# Patient Record
Sex: Female | Born: 1965
Health system: Southern US, Community
[De-identification: ages and names within clinical notes are randomized; demographics above are authoritative.]

## PROBLEM LIST (undated history)

## (undated) DIAGNOSIS — IMO0001 Reserved for inherently not codable concepts without codable children: Secondary | ICD-10-CM

## (undated) DIAGNOSIS — I10 Essential (primary) hypertension: Secondary | ICD-10-CM

## (undated) DIAGNOSIS — R002 Palpitations: Secondary | ICD-10-CM

## (undated) DIAGNOSIS — E669 Obesity, unspecified: Secondary | ICD-10-CM

## (undated) DIAGNOSIS — M65311 Trigger thumb, right thumb: Secondary | ICD-10-CM

## (undated) DIAGNOSIS — G43909 Migraine, unspecified, not intractable, without status migrainosus: Secondary | ICD-10-CM

## (undated) DIAGNOSIS — E119 Type 2 diabetes mellitus without complications: Secondary | ICD-10-CM

## (undated) DIAGNOSIS — I219 Acute myocardial infarction, unspecified: Secondary | ICD-10-CM

## (undated) DIAGNOSIS — J189 Pneumonia, unspecified organism: Secondary | ICD-10-CM

## (undated) DIAGNOSIS — Z98811 Dental restoration status: Secondary | ICD-10-CM

## (undated) DIAGNOSIS — E785 Hyperlipidemia, unspecified: Secondary | ICD-10-CM

## (undated) DIAGNOSIS — F32A Depression, unspecified: Secondary | ICD-10-CM

## (undated) DIAGNOSIS — F40240 Claustrophobia: Secondary | ICD-10-CM

## (undated) DIAGNOSIS — E11319 Type 2 diabetes mellitus with unspecified diabetic retinopathy without macular edema: Secondary | ICD-10-CM

## (undated) DIAGNOSIS — Z794 Long term (current) use of insulin: Secondary | ICD-10-CM

## (undated) HISTORY — PX: GANGLION CYST EXCISION: SHX1691

## (undated) HISTORY — PX: ABDOMINAL HYSTERECTOMY: SHX81

## (undated) HISTORY — DX: Hyperlipidemia, unspecified: E78.5

## (undated) HISTORY — PX: CARPAL TUNNEL RELEASE: SHX101

## (undated) HISTORY — DX: Essential (primary) hypertension: I10

## (undated) HISTORY — PX: DE QUERVAIN'S RELEASE: SHX1439

## (undated) HISTORY — PX: UTERINE SUSPENSION: SUR1430

## (undated) HISTORY — DX: Type 2 diabetes mellitus with unspecified diabetic retinopathy without macular edema: E11.319

## (undated) HISTORY — DX: Obesity, unspecified: E66.9

## (undated) HISTORY — PX: INGUINAL HERNIA REPAIR: SHX194

---

## 1989-02-04 HISTORY — PX: CHOLECYSTECTOMY: SHX55

## 1998-09-01 ENCOUNTER — Inpatient Hospital Stay (HOSPITAL_COMMUNITY): Admission: AD | Admit: 1998-09-01 | Discharge: 1998-09-02 | Payer: Self-pay | Admitting: Cardiology

## 2002-02-11 ENCOUNTER — Emergency Department (HOSPITAL_COMMUNITY): Admission: EM | Admit: 2002-02-11 | Discharge: 2002-02-11 | Payer: Self-pay | Admitting: *Deleted

## 2002-02-11 ENCOUNTER — Encounter: Payer: Self-pay | Admitting: *Deleted

## 2007-03-17 ENCOUNTER — Ambulatory Visit (HOSPITAL_COMMUNITY): Admission: RE | Admit: 2007-03-17 | Discharge: 2007-03-17 | Payer: Self-pay | Admitting: Gastroenterology

## 2007-05-26 ENCOUNTER — Inpatient Hospital Stay (HOSPITAL_COMMUNITY): Admission: EM | Admit: 2007-05-26 | Discharge: 2007-05-28 | Payer: Self-pay | Admitting: *Deleted

## 2007-05-27 HISTORY — PX: CARDIAC CATHETERIZATION: SHX172

## 2008-04-10 ENCOUNTER — Emergency Department (HOSPITAL_COMMUNITY): Admission: EM | Admit: 2008-04-10 | Discharge: 2008-04-10 | Payer: Self-pay | Admitting: Emergency Medicine

## 2009-08-03 ENCOUNTER — Ambulatory Visit (HOSPITAL_COMMUNITY): Admission: RE | Admit: 2009-08-03 | Discharge: 2009-08-03 | Payer: Self-pay | Admitting: Internal Medicine

## 2009-09-04 ENCOUNTER — Ambulatory Visit (HOSPITAL_COMMUNITY): Admission: RE | Admit: 2009-09-04 | Discharge: 2009-09-04 | Payer: Self-pay | Admitting: General Surgery

## 2009-09-04 HISTORY — PX: SOFT TISSUE MASS EXCISION: SHX2419

## 2010-01-19 ENCOUNTER — Inpatient Hospital Stay (HOSPITAL_COMMUNITY): Admission: EM | Admit: 2010-01-19 | Discharge: 2010-01-21 | Payer: Self-pay | Source: Home / Self Care

## 2010-02-25 ENCOUNTER — Encounter: Payer: Self-pay | Admitting: Internal Medicine

## 2010-03-29 ENCOUNTER — Other Ambulatory Visit: Payer: Self-pay | Admitting: Gastroenterology

## 2010-04-16 LAB — URINALYSIS, ROUTINE W REFLEX MICROSCOPIC
Bilirubin Urine: NEGATIVE
Glucose, UA: NEGATIVE mg/dL
Ketones, ur: NEGATIVE mg/dL
Nitrite: NEGATIVE
Protein, ur: NEGATIVE mg/dL
pH: 6 (ref 5.0–8.0)

## 2010-04-16 LAB — DIFFERENTIAL
Basophils Relative: 0 % (ref 0–1)
Eosinophils Absolute: 0.2 10*3/uL (ref 0.0–0.7)
Eosinophils Absolute: 0.4 10*3/uL (ref 0.0–0.7)
Eosinophils Relative: 2 % (ref 0–5)
Eosinophils Relative: 2 % (ref 0–5)
Lymphocytes Relative: 16 % (ref 12–46)
Lymphocytes Relative: 19 % (ref 12–46)
Lymphs Abs: 2.4 10*3/uL (ref 0.7–4.0)
Lymphs Abs: 2.4 10*3/uL (ref 0.7–4.0)
Lymphs Abs: 3 10*3/uL (ref 0.7–4.0)
Neutro Abs: 13.9 10*3/uL — ABNORMAL HIGH (ref 1.7–7.7)
Neutro Abs: 9.5 10*3/uL — ABNORMAL HIGH (ref 1.7–7.7)
Neutrophils Relative %: 72 % (ref 43–77)

## 2010-04-16 LAB — APTT: aPTT: 32 seconds (ref 24–37)

## 2010-04-16 LAB — CBC
HCT: 34.4 % — ABNORMAL LOW (ref 36.0–46.0)
HCT: 39.4 % (ref 36.0–46.0)
Hemoglobin: 11.9 g/dL — ABNORMAL LOW (ref 12.0–15.0)
MCH: 27.7 pg (ref 26.0–34.0)
MCH: 28.2 pg (ref 26.0–34.0)
MCHC: 34.6 g/dL (ref 30.0–36.0)
MCV: 80.5 fL (ref 78.0–100.0)
Platelets: 293 10*3/uL (ref 150–400)
Platelets: 305 10*3/uL (ref 150–400)
RBC: 4.4 MIL/uL (ref 3.87–5.11)
RBC: 5.01 MIL/uL (ref 3.87–5.11)
RDW: 12.9 % (ref 11.5–15.5)
RDW: 13.1 % (ref 11.5–15.5)
WBC: 18.5 10*3/uL — ABNORMAL HIGH (ref 4.0–10.5)

## 2010-04-16 LAB — COMPREHENSIVE METABOLIC PANEL
CO2: 27 mEq/L (ref 19–32)
Calcium: 8.5 mg/dL (ref 8.4–10.5)
Creatinine, Ser: 0.52 mg/dL (ref 0.4–1.2)
GFR calc non Af Amer: >60 mL/min (ref >60)
Glucose, Bld: 142 mg/dL — ABNORMAL HIGH (ref 70–99)

## 2010-04-16 LAB — PROTIME-INR: Prothrombin Time: 12.8 seconds (ref 11.6–15.2)

## 2010-04-16 LAB — BASIC METABOLIC PANEL
BUN: 8 mg/dL (ref 6–23)
Chloride: 98 mEq/L (ref 96–112)
Creatinine, Ser: 0.55 mg/dL (ref 0.4–1.2)
GFR calc Af Amer: >60 mL/min (ref >60)
GFR calc non Af Amer: >60 mL/min (ref >60)
Potassium: 3.5 mEq/L (ref 3.5–5.1)

## 2010-04-16 LAB — HEMOGLOBIN A1C
Hgb A1c MFr Bld: 6.7 % — ABNORMAL HIGH (ref <5.7)
Mean Plasma Glucose: 146 mg/dL — ABNORMAL HIGH (ref <117)

## 2010-04-16 LAB — HEPATIC FUNCTION PANEL
Albumin: 3.8 g/dL (ref 3.5–5.2)
Bilirubin, Direct: 0.1 mg/dL (ref 0.0–0.3)
Indirect Bilirubin: 0.3 mg/dL (ref 0.3–0.9)
Total Bilirubin: 0.4 mg/dL (ref 0.3–1.2)

## 2010-04-16 LAB — LIPASE, BLOOD: Lipase: 28 U/L (ref 11–59)

## 2010-04-16 LAB — GLUCOSE, CAPILLARY
Glucose-Capillary: 105 mg/dL — ABNORMAL HIGH (ref 70–99)
Glucose-Capillary: 117 mg/dL — ABNORMAL HIGH (ref 70–99)
Glucose-Capillary: 144 mg/dL — ABNORMAL HIGH (ref 70–99)
Glucose-Capillary: 99 mg/dL (ref 70–99)

## 2010-04-21 LAB — CBC
HCT: 36.2 % (ref 36.0–46.0)
Hemoglobin: 12.4 g/dL (ref 12.0–15.0)
MCHC: 34.4 g/dL (ref 30.0–36.0)
WBC: 12.8 10*3/uL — ABNORMAL HIGH (ref 4.0–10.5)

## 2010-04-21 LAB — URINALYSIS, ROUTINE W REFLEX MICROSCOPIC
Ketones, ur: NEGATIVE mg/dL
Protein, ur: NEGATIVE mg/dL
Urobilinogen, UA: 0.2 mg/dL (ref 0.0–1.0)

## 2010-04-21 LAB — COMPREHENSIVE METABOLIC PANEL
ALT: 24 U/L (ref 0–35)
Alkaline Phosphatase: 67 U/L (ref 39–117)
CO2: 27 mEq/L (ref 19–32)
Chloride: 107 mEq/L (ref 96–112)
GFR calc non Af Amer: >60 mL/min (ref >60)
Glucose, Bld: 182 mg/dL — ABNORMAL HIGH (ref 70–99)
Potassium: 3.7 mEq/L (ref 3.5–5.1)
Sodium: 140 mEq/L (ref 135–145)
Total Bilirubin: 0.6 mg/dL (ref 0.3–1.2)

## 2010-04-21 LAB — SURGICAL PCR SCREEN: MRSA, PCR: NEGATIVE

## 2010-04-21 LAB — DIFFERENTIAL
Basophils Relative: 1 % (ref 0–1)
Eosinophils Absolute: 0.3 10*3/uL (ref 0.0–0.7)
Neutrophils Relative %: 74 % (ref 43–77)

## 2010-05-17 LAB — GLUCOSE, CAPILLARY: Glucose-Capillary: 223 mg/dL — ABNORMAL HIGH (ref 70–99)

## 2010-06-19 NOTE — H&P (Signed)
[name], [name]                 ACCOUNT NO.:  [id number]   MEDICAL RECORD NO.:  [id number]         PATIENT TYPE:  PEMS   LOCATION:  ED                            FACILITY:  [facility]   PHYSICIAN:  Mila Homer. Sudie Bailey, M.D.DATE OF BIRTH:  May 27, 1965   DATE OF ADMISSION:  05/26/2007  DATE OF DISCHARGE:  04/21/2009LH                              HISTORY & PHYSICAL   SUBJECTIVE:  This 45 year old woman was at home this morning having  finished eating out with her husband, and around 11:30 a.m. developed  severe substernal chest pain.  Pain persisted, she came to the hospital.   She has had bouts of indigestion noted before in the upper abdomen,  but nothing ever in the sternum, never has had heartburn.   FAMILY HISTORY:  Remarkable.  On her paternal side, her grandfather died  at age 55 of heart-related problems.  Her father had a massive heart  attack with CHF, died at age 28 about 6 years ago, and she has a  brother age 79, who has had a massive heart attack  and now has lost  the left side of his heart and is disabled. Family members with heart  disease being followed by Dr. Alanda Amass of [facility]  [facility].  The patient denied palpitations or cough.   The patient herself, currently is age 59, has a number of risk factors  including diabetes, hypertension, hypercholesterolemia, and cigarette  smoking.  She stopped smoking 11 years ago, but had smoked for 18 years  in the meantime.  She averaged about a pack a day.   CURRENT MEDICATIONS:  1. Lisinopril.  2. Hydrochlorothiazide  20/25 daily.  3. Metformin 1000 mg b.i.d.  4. Glipizide XR 10 mg daily.  5. Norvasc 5 mg daily.   HOSPITAL COURSE:  She was seen in the emergency room by Dr. Margretta Ditty, ER  physician, and treated with a number of agents without benefit.  She had  GI cocktail, which did not help; nitroglycerin sublingually, which did  not help; and  most recently is getting Maalox.  She has even had  morphine 2  mg IV.   PHYSICAL EXAMINATION:  GENERAL:  The ER exam showed a pleasant middle-  aged woman who is obviously obese.  She is in distress due to the  substernal chest pain and foot pain with the palm on the chest.  Color  was good.  She appeared to be good historian.  Husband was also in the  room with her.  She is oriented, alert, well-developed, and well-  nourished.  SKIN:  Turgor is normal.  HEENT:  Mucous membranes are moist.  HEART:  Regular rhythm and rate of 80 on my exam.  LUNGS:  Clear throughout and she is moving air well.  ABDOMEN:  Soft without organomegaly or mass.  She had absolutely no  epigastric pain on palpation.  All the pains she complained  of was  actually substernal.  EXTREMITIES:  There is no edema of the ankles.   VITAL SIGNS:  On admission showed temperature 97.8, blood pressure  146/82, pulse  84, respiratory rate 16, and O2 sat 97%.   LABORATORY DATA:  Her admission white cell count is 13,500 of which 75%  were neutrophils, and 18% lymphocytes.  H&H was 13.2/37.1, MCV of 81,  and platelet count of 324,000.  Her admission myoglobin was 37, CK-MB  1.9, troponin less than 0.05 and her BMP showed a glucose 256, BUN 6,  and creatinine 0.50.   EKG was seen and evaluated by the ER physician, apparently was normal.   DIAGNOSES:  1. Persistent chest pain now that has been present for 6-1/2 hours and      does not resolve with any medication given so far.  2. Very positive family history for coronary artery disease.  3. Positive risk factor for coronary artery disease.  4. Benign essential hypertension.  5. Type 2 diabetes.  6. Hypercholesterolemia.  7. A history of tobacco use.  8. Morbid obesity.   PLAN:  I have discussed with Dr. Earlyne Iba.  Requesting of the  stat EKG and cardiac enzymes and also request that she be transferred  down to [facility] to be attended to by [facility],  a member of which is Dr. Alanda Amass, the family  cardiologist.  If [facility] is full she can be followed here at least overnight on a  monitor and IV fluids but preferably transfer given the family history  and the personal history of risk factors for coronary artery disease.      Mila Homer. Sudie Bailey, M.D.  Electronically Signed     SDK/MEDQ  D:  05/26/2007  T:  05/27/2007  Job:  [id number]

## 2010-06-19 NOTE — Cardiovascular Report (Signed)
NAMEMEGHANNE, [name]                 ACCOUNT NO.:  [id number]   MEDICAL RECORD NO.:  [id number]          PATIENT TYPE:  INP   LOCATION:  3728                         FACILITY:  [facility]   PHYSICIAN:  Nicki Guadalajara, M.D.     DATE OF BIRTH:  July 29, 1965   DATE OF PROCEDURE:  DATE OF DISCHARGE:  05/26/2007                            CARDIAC CATHETERIZATION   INDICATIONS:  Ms. [name] is a 45 year old female with a history of  type 2 diabetes mellitus, hypertension, and strong family history of  coronary artery disease.  She was admitted to [facility]  yesterday after experiencing episodes of intermittent squeezing chest  pressure.  She works as a Engineer, civil (consulting) at [employer].  She denies any  significant exertional symptomatology.  She was admitted to [facility] last evening.  In light of her family history and cardiac risk  factors, definitive cardiac catheterization was recommended.   PROCEDURE:  After premedication with Versed intravenously, the patient  was prepped and draped in usual fashion.  She also complained of some  mild low back discomfort and also received 25 mg of fentanyl.  Her right  femoral artery was punctured anteriorly and a 5-French sheath was  inserted.  Diagnostic catheterization was done utilizing 5-French  Judkins for left and right coronary catheters.  A 5-French pigtail  catheter was used for RAO ventriculography.  With her hypertensive  history, distal aortography was also performed to exclude any potential  for renal artery stenosis.  She tolerated the procedure well.  Hemostasis was obtained by direct manual pressure.   HEMODYNAMIC DATA:  Central aortic pressure is 125/75.  Left ventricular  pressure is 125/14.   ANGIOGRAPHIC DATA:  The left main coronary artery was angiographically  normal and trifurcated into an LAD.  A large ramus intermediate vessel,  and a moderate circumflex vessel.  The left main was angiographically  normal.   The LAD was a moderate-sized vessel that gave rise to a large septal  perforating artery and large diagonal vessel.  The LAD extended to, but  did not wrap around the LV apex.   The ramus intermediate vessel was a very large caliber vessel that gave  rise to several branches and extended to the LV apex.   The AV groove circumflex was small-to-moderate size vessel that gave  rise to one small marginal vessel.   The right coronary artery was a large-caliber dominant vessel that gave  rise to a very large PDA system, which supplied the entire inferior wall  and extended to the inferior LV apex.  The distal RCA also gave rise to  an inferior LV branch as well as a posterolateral coronary artery.  The  RCA and its branches were normal.   RAO ventriculography revealed normal LV contractility without focal  segmental wall motion abnormalities.   Distal aortography revealed a normal aortoiliac system.  Renal arteries  were widely patent without stenosis.   IMPRESSION:  1. Normal LV function.  2. Normal coronary arteries.           ______________________________  Nicki Guadalajara,  M.D.     TK/MEDQ  D:  05/27/2007  T:  05/28/2007  Job:  [id number]   cc:   Dani Gobble, MD  Richard A. Alanda Amass, M.D.

## 2010-06-19 NOTE — Op Note (Signed)
NAMESANDER, SPECKMAN                 ACCOUNT NO.:  [id number]   MEDICAL RECORD NO.:  [id number]          PATIENT TYPE:  AMB   LOCATION:  ENDO                         FACILITY:  [facility]   PHYSICIAN:  John C. Madilyn Fireman, M.D.    DATE OF BIRTH:  1966/01/18   DATE OF PROCEDURE:  03/17/2007  DATE OF DISCHARGE:                               OPERATIVE REPORT   PROCEDURE:  Colonoscopy, polypectomy.   INDICATIONS FOR PROCEDURE:  Strong family history of colon cancer in two  first-degree relatives.   PROCEDURE:  The patient was placed in the left lateral decubitus  position and placed on the pulse monitor with continuous low-flow oxygen  delivered by nasal cannula.  She was sedated with 125 mcg IV fentanyl  and 12.5 mg IV Versed.  The Olympus video colonoscope was inserted into  the rectum and advanced to the cecum, confirmed by transillumination of  McBurney's point and visualization of ileocecal valve and appendiceal  orifice.  The prep was good.  The cecum, ascending, transverse,  descending and sigmoid colon all appeared normal with no masses, polyps,  diverticula or other mucosal abnormalities.  At the rectosigmoid  junction there was a small polyp approximately 6 mm in diameter.  This  removed by snare.  The remainder of colon appeared normal.  Scope was  then withdrawn and the patient returned to the recovery room in stable  condition.  She tolerated the procedure well.  There were no immediate  complications.   IMPRESSION:  1. Small rectosigmoid polyp, otherwise normal study.   PLAN:  Await histology and will probably repeat colonoscopy in 3 years.           ______________________________  Everardo All Madilyn Fireman, M.D.     JCH/MEDQ  D:  03/17/2007  T:  03/18/2007  Job:  [id number]   cc:   Catalina Pizza, M.D.

## 2010-06-22 NOTE — Discharge Summary (Signed)
[name], [name]                 ACCOUNT NO.:  [id number]   MEDICAL RECORD NO.:  [id number]          PATIENT TYPE:  INP   LOCATION:  3728                         FACILITY:  [facility]   PHYSICIAN:  [name], M.D.     DATE OF BIRTH:  10/05/1965   DATE OF ADMISSION:  05/26/2007  DATE OF DISCHARGE:  05/28/2007                               DISCHARGE SUMMARY   HISTORY OF PRESENT ILLNESS:  [name] is a 45 year old female  with a prior medical history of NIDDM, hypertension, and premature  family history of heart disease.  She came to the emergency room with  complaints of sudden onset of chest pain.  She was admitted, placed on  intravenous heparin and nitroglycerin, and decision was for her to  undergo cardiac catheterization.  This was performed on May 27, 2007,  by Dr. Nicki Conner.  She had normal coronary arteries and normal LV  function.  On May 28, 2007, she was seen by Dr. Nicki Conner,  considered stable for discharged home with blood pressure of 128/76, her  heart rate was 86, respirations 18, O2 saturation 96%, and her  temperature was 98.4.   LABORATORY DATA:  Hemoglobin 12.2, hematocrit 34.4, WBC 15.2, and  platelets 295.  Sodium 129,  potassium 3.8, BUN 2, creatinine 0.43, and  glucose was 202.  Hemoglobin A1c was 8.6 and amylase was 15.  CK-MBs and  troponins were negative x2.  Total cholesterol was 186, triglycerides  363, HDL was 23, and LDL was 90.  TSH was 3.073.  Urine had bacteria,  but the culture revealed 50,000 of multiple bacterial types.  I do not  see a chest x-ray report in the chart at the time of this dictation.   DISCHARGE MEDICATIONS:  1. Lisinopril/hydrochlorothiazide 20/25 daily.  2. Metformin.  She is to hold until 48 hours of her cath, which will      be restarted on May 30, 2007.  3. Glipizide 10 mg everyday.  4. Norvasc 5 mg everyday.  5. Aspirin 81 mg a day.  6. She wishes to start TriCor 145 mg a day and Crestor 10 mg a day.  7.  She will increase her aspirin to 325 mg a day.   DISCHARGE DIAGNOSES:  1. Chest pain not coronary ischemia, possible musculoskeletal versus      gastrointestinal etiology.  2. Non-insulin-dependent diabetes mellitus, not well controlled.  3. Dyslipidemia.  4. Gastroesophageal reflux disease, questionable esophageal spasm.      [name], N.P.    ______________________________  [name], M.D.    BB/MEDQ  D:  06/22/2007  T:  06/23/2007  Job:  [id number]

## 2010-10-30 LAB — URINALYSIS, MICROSCOPIC ONLY
Glucose, UA: NEGATIVE
Ketones, ur: NEGATIVE
Leukocytes, UA: NEGATIVE
Nitrite: NEGATIVE
Protein, ur: NEGATIVE
Urobilinogen, UA: 1

## 2010-10-30 LAB — HEPARIN LEVEL (UNFRACTIONATED)
Heparin Unfractionated: 0.23 — ABNORMAL LOW
Heparin Unfractionated: <0.1 — ABNORMAL LOW

## 2010-10-30 LAB — BASIC METABOLIC PANEL
BUN: 2 — ABNORMAL LOW
CO2: 28
CO2: 28
Calcium: 9.3
Calcium: 8.5
Chloride: 103
Chloride: 103
Creatinine, Ser: 0.43
Creatinine, Ser: 0.49
GFR calc Af Amer: >60
GFR calc non Af Amer: >60
Glucose, Bld: 256 — ABNORMAL HIGH
Glucose, Bld: 202 — ABNORMAL HIGH
Glucose, Bld: 204 — ABNORMAL HIGH
Potassium: 3.8
Sodium: 136
Sodium: 135

## 2010-10-30 LAB — LIPID PANEL
HDL: 23 — ABNORMAL LOW
Total CHOL/HDL Ratio: 8.1
Triglycerides: 363 — ABNORMAL HIGH

## 2010-10-30 LAB — DIFFERENTIAL
Basophils Absolute: 0
Basophils Relative: 0
Eosinophils Absolute: 0.2
Eosinophils Relative: 2
Monocytes Absolute: 0.8
Monocytes Relative: 6
Neutro Abs: 10.1 — ABNORMAL HIGH

## 2010-10-30 LAB — HEMOGLOBIN A1C: Hgb A1c MFr Bld: 8.6 — ABNORMAL HIGH

## 2010-10-30 LAB — CBC
HCT: 34.4 — ABNORMAL LOW
Hemoglobin: 13.2
MCHC: 35.4
MCHC: 34.5
MCHC: 35.4
MCV: 81
MCV: 81.5
Platelets: 295
Platelets: 315
RBC: 4.33
RDW: 12.6
WBC: 15.2 — ABNORMAL HIGH
WBC: 16.2 — ABNORMAL HIGH

## 2010-10-30 LAB — TROPONIN I

## 2010-10-30 LAB — CK TOTAL AND CKMB (NOT AT ARMC)
CK, MB: 2.3
CK, MB: 2.9
CK, MB: 2.1
Relative Index: INVALID
Relative Index: INVALID
Total CK: 45

## 2010-10-30 LAB — POCT CARDIAC MARKERS
CKMB, poc: 1.9
Myoglobin, poc: 37.2
Troponin i, poc: <0.05

## 2010-10-30 LAB — TSH: TSH: 3.073

## 2010-10-30 LAB — URINE CULTURE: Colony Count: 50000

## 2010-10-30 LAB — AMYLASE: Amylase: 15 — ABNORMAL LOW

## 2010-10-30 LAB — PROTIME-INR: Prothrombin Time: 13.3

## 2011-07-17 ENCOUNTER — Ambulatory Visit: Payer: Self-pay

## 2011-08-12 ENCOUNTER — Other Ambulatory Visit: Payer: Self-pay | Admitting: Internal Medicine

## 2011-08-12 DIAGNOSIS — R1010 Upper abdominal pain, unspecified: Secondary | ICD-10-CM

## 2011-08-13 ENCOUNTER — Ambulatory Visit
Admission: RE | Admit: 2011-08-13 | Discharge: 2011-08-13 | Disposition: A | Discharge status: Home / Self Care | Payer: 59 | Source: Ambulatory Visit | Attending: Internal Medicine | Admitting: Internal Medicine

## 2011-08-13 ENCOUNTER — Ambulatory Visit: Admission: RE | Admit: 2011-08-13 | Payer: 59 | Source: Ambulatory Visit

## 2011-08-13 ENCOUNTER — Other Ambulatory Visit: Payer: Self-pay | Admitting: Internal Medicine

## 2011-08-13 ENCOUNTER — Other Ambulatory Visit: Payer: 59

## 2011-08-13 DIAGNOSIS — R1010 Upper abdominal pain, unspecified: Secondary | ICD-10-CM

## 2011-08-13 DIAGNOSIS — R52 Pain, unspecified: Secondary | ICD-10-CM

## 2011-08-13 MED ORDER — IOHEXOL 300 MG/ML  SOLN
125.0000 mL | Freq: Once | INTRAMUSCULAR | Status: AC | PRN
  Administered 2011-08-13: 125 mL via INTRAVENOUS

## 2011-08-22 ENCOUNTER — Ambulatory Visit: Payer: Self-pay

## 2011-09-19 ENCOUNTER — Encounter: Payer: 59 | Attending: Internal Medicine | Admitting: Dietician

## 2011-09-19 VITALS — Ht 68.0 in | Wt 241.0 lb

## 2011-09-19 DIAGNOSIS — E119 Type 2 diabetes mellitus without complications: Secondary | ICD-10-CM

## 2011-09-21 ENCOUNTER — Encounter: Payer: Self-pay | Admitting: Dietician

## 2011-09-21 NOTE — Progress Notes (Signed)
  Patient was seen on 09/19/2011 for the first of a series of three diabetes self-management courses at the [facility]. The following learning objectives were met by the patient during this course:   Defines the role of glucose and insulin  Identifies type of diabetes and pathophysiology  Defines the diagnostic criteria for diabetes and prediabetes  States the risk factors for Type 2 Diabetes  States the symptoms of Type 2 Diabetes  Defines Type 2 Diabetes treatment goals  Defines Type 2 Diabetes treatment options  States the rationale for glucose monitoring  Identifies A1C, glucose targets, and testing times  Identifies proper sharps disposal  Defines the purpose of a diabetes food plan  Identifies carbohydrate food groups  Defines effects of carbohydrate foods on glucose levels  Identifies carbohydrate choices/grams/food labels  States benefits of physical activity and effect on glucose  Review of suggested activity guidelines  Handouts given during class include:  Type 2 Diabetes: Basics Book  My Food Plan Book  Food and Activity Log  Current A1C: 7.7%  Patient has established the following initial goals:  Increase exercise  Work on stress.  Follow a diabetes meal plan  Lose Weight  Follow-Up Plan: Complete the two remaining Diabetes Self-Management Core Classes

## 2011-11-09 ENCOUNTER — Ambulatory Visit: Payer: 59

## 2011-11-21 ENCOUNTER — Ambulatory Visit: Payer: 59

## 2012-02-24 ENCOUNTER — Other Ambulatory Visit: Payer: Self-pay | Admitting: Internal Medicine

## 2012-02-24 DIAGNOSIS — R911 Solitary pulmonary nodule: Secondary | ICD-10-CM

## 2012-02-27 ENCOUNTER — Ambulatory Visit
Admission: RE | Admit: 2012-02-27 | Discharge: 2012-02-27 | Disposition: A | Discharge status: Home / Self Care | Payer: 59 | Source: Ambulatory Visit | Attending: Internal Medicine | Admitting: Internal Medicine

## 2012-02-27 DIAGNOSIS — R911 Solitary pulmonary nodule: Secondary | ICD-10-CM

## 2012-02-27 MED ORDER — IOHEXOL 300 MG/ML  SOLN
75.0000 mL | Freq: Once | INTRAMUSCULAR | Status: AC | PRN
  Administered 2012-02-27: 75 mL via INTRAVENOUS

## 2013-05-26 ENCOUNTER — Other Ambulatory Visit: Payer: Self-pay

## 2013-05-26 DIAGNOSIS — Z1231 Encounter for screening mammogram for malignant neoplasm of breast: Secondary | ICD-10-CM

## 2013-06-07 ENCOUNTER — Encounter (INDEPENDENT_AMBULATORY_CARE_PROVIDER_SITE_OTHER): Payer: Self-pay

## 2013-06-07 ENCOUNTER — Ambulatory Visit
Admission: RE | Admit: 2013-06-07 | Discharge: 2013-06-07 | Disposition: A | Discharge status: Home / Self Care | Payer: 59 | Source: Ambulatory Visit

## 2013-06-07 DIAGNOSIS — Z1231 Encounter for screening mammogram for malignant neoplasm of breast: Secondary | ICD-10-CM

## 2013-07-22 ENCOUNTER — Other Ambulatory Visit (HOSPITAL_COMMUNITY): Payer: Self-pay | Admitting: Orthopedic Surgery

## 2013-07-22 DIAGNOSIS — M25512 Pain in left shoulder: Secondary | ICD-10-CM

## 2013-07-27 ENCOUNTER — Ambulatory Visit (HOSPITAL_COMMUNITY): Payer: 59

## 2013-08-09 ENCOUNTER — Ambulatory Visit (HOSPITAL_COMMUNITY)
Admission: RE | Admit: 2013-08-09 | Discharge: 2013-08-09 | Disposition: A | Discharge status: Home / Self Care | Payer: 59 | Source: Ambulatory Visit | Attending: Orthopedic Surgery | Admitting: Orthopedic Surgery

## 2013-08-09 DIAGNOSIS — M25512 Pain in left shoulder: Secondary | ICD-10-CM

## 2015-09-13 ENCOUNTER — Other Ambulatory Visit: Payer: Self-pay | Admitting: Physician Assistant

## 2015-10-11 ENCOUNTER — Emergency Department
Admission: EM | Admit: 2015-10-11 | Discharge: 2015-10-11 | Disposition: A | Discharge status: Home / Self Care | Payer: 59 | Attending: Student in an Organized Health Care Education/Training Program | Admitting: Student in an Organized Health Care Education/Training Program

## 2015-10-11 ENCOUNTER — Emergency Department: Payer: 59

## 2015-10-11 ENCOUNTER — Encounter: Payer: Self-pay | Admitting: Emergency Medicine

## 2015-10-11 DIAGNOSIS — Y999 Unspecified external cause status: Secondary | ICD-10-CM | POA: Insufficient documentation

## 2015-10-11 DIAGNOSIS — Y92410 Unspecified street and highway as the place of occurrence of the external cause: Secondary | ICD-10-CM | POA: Insufficient documentation

## 2015-10-11 DIAGNOSIS — I10 Essential (primary) hypertension: Secondary | ICD-10-CM | POA: Diagnosis not present

## 2015-10-11 DIAGNOSIS — S39012A Strain of muscle, fascia and tendon of lower back, initial encounter: Secondary | ICD-10-CM | POA: Diagnosis not present

## 2015-10-11 DIAGNOSIS — R0789 Other chest pain: Secondary | ICD-10-CM | POA: Insufficient documentation

## 2015-10-11 DIAGNOSIS — S40011A Contusion of right shoulder, initial encounter: Secondary | ICD-10-CM | POA: Insufficient documentation

## 2015-10-11 DIAGNOSIS — Y939 Activity, unspecified: Secondary | ICD-10-CM | POA: Diagnosis not present

## 2015-10-11 DIAGNOSIS — Z7984 Long term (current) use of oral hypoglycemic drugs: Secondary | ICD-10-CM | POA: Diagnosis not present

## 2015-10-11 DIAGNOSIS — Z794 Long term (current) use of insulin: Secondary | ICD-10-CM | POA: Diagnosis not present

## 2015-10-11 DIAGNOSIS — S3992XA Unspecified injury of lower back, initial encounter: Secondary | ICD-10-CM | POA: Diagnosis present

## 2015-10-11 DIAGNOSIS — E119 Type 2 diabetes mellitus without complications: Secondary | ICD-10-CM | POA: Diagnosis not present

## 2015-10-11 MED ORDER — KETOROLAC TROMETHAMINE 30 MG/ML IJ SOLN
30.0000 mg | Freq: Once | INTRAMUSCULAR | Status: AC
  Administered 2015-10-11: 30 mg via INTRAMUSCULAR
  Filled 2015-10-11: qty 1

## 2015-10-11 MED ORDER — METHOCARBAMOL 500 MG PO TABS: 500.0000 mg | ORAL_TABLET | Freq: Four times a day (QID) | ORAL | 0 refills | Status: DC

## 2015-10-11 MED ORDER — MELOXICAM 15 MG PO TABS: 15.0000 mg | ORAL_TABLET | Freq: Every day | ORAL | 0 refills | Status: DC

## 2015-10-11 NOTE — ED Notes (Signed)
AAOx3.  Skin warm and dry. C/O pain to left shoulder and low back.  NAD.
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
## 2015-10-11 NOTE — ED Notes (Signed)
Toradol given per order.  Sling placed to right arm.  C/O lower back pain.  Roderic Palau Cuthriell notified of patient's c/o pain.  Continue to monitor.

## 2015-10-11 NOTE — ED Notes (Signed)
Pt. Given work excuse.  Pt. Going home with family.

## 2015-11-08 ENCOUNTER — Encounter: Payer: Self-pay | Admitting: Neurology

## 2015-11-08 ENCOUNTER — Ambulatory Visit (INDEPENDENT_AMBULATORY_CARE_PROVIDER_SITE_OTHER): Payer: 59 | Admitting: Neurology

## 2015-11-08 DIAGNOSIS — G43709 Chronic migraine without aura, not intractable, without status migrainosus: Secondary | ICD-10-CM | POA: Diagnosis not present

## 2015-11-08 DIAGNOSIS — IMO0002 Reserved for concepts with insufficient information to code with codable children: Secondary | ICD-10-CM | POA: Insufficient documentation

## 2015-11-08 MED ORDER — SUMATRIPTAN SUCCINATE 25 MG PO TABS: 25.0000 mg | ORAL_TABLET | ORAL | 6 refills | Status: DC | PRN

## 2015-11-08 MED ORDER — TOPIRAMATE 25 MG PO TABS: 50.0000 mg | ORAL_TABLET | Freq: Two times a day (BID) | ORAL | 6 refills | Status: DC

## 2015-11-08 NOTE — Progress Notes (Signed)
PATIENT: [name] DOB: [birth year]  Chief Complaint  Patient presents with  . Optic Nerve Edema    She is here with her husband, [place]. She had a recent abnormal eye exam that revealed bilateral optic nerve edema.  Reports a constant "heartbeat" in her ears and daily headaches.     HISTORICAL  [name], seen in refer by  Harlen Labs, MD, her primary care physician is Dr. Glendale Chard  She had a past medical history of hypertension, hyperlipidemia, diabetes since 2007, insulin-dependent since 2013, history of ulcerative colitis, which is under good control now, She is a Marine scientist, worked to home, looking at computer has been seen by Dr. Marin Comment since 2014, I reviewed and summarized in note from Dr. Marin Comment on October 06 2015, there was a concern of moderate bilateral papillary edema no evidence of visual field loss, bilateral optic disc 0.2/0.2 cup-to-disc ratio,   She had a history of migraine since 2016, described as ocular migraine, often started with visual distortion, sometimes loss of peripheral visual field, followed by behind eye pain, since August 2017, she began to have daily ocular migraines, blurry vision after  staring at her computer for a few hours, behind eye pain, blurry vision, floating travels through her eye field, mild retro-orbital area headache. She has been taking ibuprofen on a daily basis every 6 hours  She also complains frequent dizziness, transient, sometimes associated with her headaches, can happen in standing or sitting down position,  REVIEW OF SYSTEMS: Full 14 system review of systems performed and notable only for feeling hot, flushing, memory loss, headaches, dizziness, blurred vision, eye pain, fatigue, palpitations   ALLERGIES: Allergies  Allergen Reactions  . Azo [Phenazopyridine] Other (See Comments)    Facial swelling, hives, chest tightness.  . Morphine And Related     Dizziness, Vomiting  . Sulfa Antibiotics Rash    HOME  MEDICATIONS: Current Outpatient Prescriptions  Medication Sig Dispense Refill  . amLODipine (NORVASC) 5 MG tablet Take 5 mg by mouth daily.    . Cholecalciferol (VITAMIN D3) 5000 units TABS Take by mouth daily.    . insulin NPH-regular Human (NOVOLIN 70/30) (70-30) 100 UNIT/ML injection Inject 100 Units into the skin daily.    . Magnesium 500 MG TABS Take by mouth daily.    . meclizine (ANTIVERT) 25 MG tablet Take 25 mg by mouth 3 (three) times daily as needed for dizziness.    . meloxicam (MOBIC) 15 MG tablet Take 1 tablet (15 mg total) by mouth daily. 30 tablet 0  . methocarbamol (ROBAXIN) 500 MG tablet Take 1 tablet (500 mg total) by mouth 4 (four) times daily. 16 tablet 0  . metoprolol (LOPRESSOR) 50 MG tablet daily.    . simvastatin (ZOCOR) 20 MG tablet Take 20 mg by mouth daily.    . valsartan-hydrochlorothiazide (DIOVAN-HCT) 320-12.5 MG tablet Take 1 tablet by mouth daily.    Marland Kitchen XIIDRA 5 % SOLN 2 (two) times daily.     No current facility-administered medications for this visit.     PAST MEDICAL HISTORY: Past Medical History:  Diagnosis Date  . Colitis, ulcerative ([place])   . Diabetes mellitus   . Hyperlipidemia   . Hypertension   . Migraine   . Obesity   . Optic nerve edema   . Ulcerative colitis ([place])     PAST SURGICAL HISTORY: Past Surgical History:  Procedure Laterality Date  . ABDOMINAL HYSTERECTOMY    . CARPAL TUNNEL RELEASE    .  CHOLECYSTECTOMY    . HERNIA REPAIR    . LIPOMA EXCISION      FAMILY HISTORY: Family History  Problem Relation Age of Onset  . Cancer Other   . Hypertension Other   . Hyperlipidemia Other   . Heart attack Other   . Cancer Mother     Colorectal  . Heart disease Father   . Heart attack Father   . Cancer Sister     Colorectal  . Cancer Brother     Colorectal  . Heart disease Brother   . Heart attack Brother   . Heart disease Paternal Grandmother   . Heart disease Paternal Grandfather     SOCIAL HISTORY:  Social History    Social History  . Marital status: Married    Spouse name: N/A  . Number of children: 2  . Years of education: Associates   Occupational History  . Nurse    Social History Main Topics  . Smoking status: Former Smoker    Quit date: 1998  . Smokeless tobacco: Never Used  . Alcohol use Yes     Comment: Occasionally  . Drug use: No  . Sexual activity: Not on file   Other Topics Concern  . Not on file   Social History Narrative   Lives at home with husband.   Right-handed.   Occasional use of caffeine.     PHYSICAL EXAM   Vitals:   11/08/15 0938  BP: (!) 142/80  Pulse: 73  Weight: 241 lb 4 oz (109.4 kg)  Height: 5\' 7"  (1.702 m)    Not recorded      Body mass index is 37.79 kg/m.  PHYSICAL EXAMNIATION:  Gen: NAD, conversant, well nourised, obese, well groomed                     Cardiovascular: Regular rate rhythm, no peripheral edema, warm, nontender. Eyes: Conjunctivae clear without exudates or hemorrhage Neck: Supple, no carotid bruise. Pulmonary: Clear to auscultation bilaterally   NEUROLOGICAL EXAM:  MENTAL STATUS: Speech:    Speech is normal; fluent and spontaneous with normal comprehension.  Cognition:     Orientation to time, place and person     Normal recent and remote memory     Normal Attention span and concentration     Normal Language, naming, repeating,spontaneous speech     Fund of knowledge   CRANIAL NERVES: CN II: Visual fields are full to confrontation. Fundoscopic exam is normal with sharp discs and no vascular changes. Pupils are round equal and briskly reactive to light. CN III, IV, VI: extraocular movement are normal. No ptosis. CN V: Facial sensation is intact to pinprick in all 3 divisions bilaterally. Corneal responses are intact.  CN VII: Face is symmetric with normal eye closure and smile. CN VIII: Hearing is normal to rubbing fingers CN IX, X: Palate elevates symmetrically. Phonation is normal. CN XI: Head turning and  shoulder shrug are intact CN XII: Tongue is midline with normal movements and no atrophy.  MOTOR: There is no pronator drift of out-stretched arms. Muscle bulk and tone are normal. Muscle strength is normal.  REFLEXES: Reflexes are 2+ and symmetric at the biceps, triceps, knees, and ankles. Plantar responses are flexor.  SENSORY: Intact to light touch, pinprick, positional sensation and vibratory sensation are intact in fingers and toes.  COORDINATION: Rapid alternating movements and fine finger movements are intact. There is no dysmetria on finger-to-nose and heel-knee-shin.    GAIT/STANCE: Posture is  normal. Gait is steady with normal steps, base, arm swing, and turning. Heel and toe walking are normal. Tandem gait is normal.  Romberg is absent.   DIAGNOSTIC DATA (LABS, IMAGING, TESTING) - I reviewed patient records, labs, notes, testing and imaging myself where available.   ASSESSMENT AND PLAN  CARLETHIA GORNICK is a 50 y.o. female   Possible bilateral papillary edema,  though I could not appreciated today, was documented by her most recent optometrist to visit Proceed with MRI of the brain   Chronic migraine headaches,   likely a component of medicine rebound headache, she has been taking ibuprofen every 6 hours I have suggested her to stop date the and analgesic use Start Topamax titrating to 25 mg 2 tablets twice a day Imitrex 25 mg as needed for moderate to severe headache, Try to identify and avoid triggers   Marcial Pacas, M.D. Ph.D.  [facility] [address], [place], [place] [address] Ph: [phone] Fax: [phone]  CC: Adelina Mings Margret Chance, MD, her primary care physician is Dr. Glendale Chard

## 2015-12-07 ENCOUNTER — Other Ambulatory Visit: Payer: Self-pay | Admitting: *Deleted

## 2015-12-07 ENCOUNTER — Telehealth: Payer: Self-pay | Admitting: *Deleted

## 2015-12-07 MED ORDER — ALPRAZOLAM 0.5 MG PO TABS: ORAL_TABLET | ORAL | 0 refills | Status: DC

## 2015-12-07 NOTE — Telephone Encounter (Signed)
Printed, signed, faxed and confirmed to [facility].

## 2015-12-11 ENCOUNTER — Ambulatory Visit: Payer: 59 | Admitting: Neurology

## 2015-12-13 ENCOUNTER — Other Ambulatory Visit: Payer: Self-pay | Admitting: Family Medicine

## 2015-12-13 DIAGNOSIS — S39012A Strain of muscle, fascia and tendon of lower back, initial encounter: Secondary | ICD-10-CM

## 2015-12-13 DIAGNOSIS — S161XXA Strain of muscle, fascia and tendon at neck level, initial encounter: Secondary | ICD-10-CM

## 2015-12-16 ENCOUNTER — Ambulatory Visit
Admission: RE | Admit: 2015-12-16 | Discharge: 2015-12-16 | Disposition: A | Discharge status: Home / Self Care | Payer: 59 | Source: Ambulatory Visit | Attending: Neurology | Admitting: Neurology

## 2015-12-16 DIAGNOSIS — IMO0002 Reserved for concepts with insufficient information to code with codable children: Secondary | ICD-10-CM

## 2015-12-16 DIAGNOSIS — G43709 Chronic migraine without aura, not intractable, without status migrainosus: Secondary | ICD-10-CM | POA: Diagnosis not present

## 2015-12-23 ENCOUNTER — Ambulatory Visit
Admission: RE | Admit: 2015-12-23 | Discharge: 2015-12-23 | Disposition: A | Discharge status: Home / Self Care | Payer: 59 | Source: Ambulatory Visit | Attending: Family Medicine | Admitting: Family Medicine

## 2015-12-23 DIAGNOSIS — S39012A Strain of muscle, fascia and tendon of lower back, initial encounter: Secondary | ICD-10-CM

## 2015-12-23 DIAGNOSIS — S161XXA Strain of muscle, fascia and tendon at neck level, initial encounter: Secondary | ICD-10-CM

## 2016-01-03 ENCOUNTER — Other Ambulatory Visit: Payer: Self-pay | Admitting: *Deleted

## 2016-01-03 MED ORDER — TOPIRAMATE 25 MG PO TABS: 50.0000 mg | ORAL_TABLET | Freq: Two times a day (BID) | ORAL | 1 refills | Status: DC

## 2016-01-10 ENCOUNTER — Ambulatory Visit (INDEPENDENT_AMBULATORY_CARE_PROVIDER_SITE_OTHER): Payer: 59 | Admitting: Neurology

## 2016-01-10 ENCOUNTER — Encounter: Payer: Self-pay | Admitting: Neurology

## 2016-01-10 VITALS — BP 143/79 | HR 72 | Ht 67.0 in | Wt 241.2 lb

## 2016-01-10 DIAGNOSIS — G43709 Chronic migraine without aura, not intractable, without status migrainosus: Secondary | ICD-10-CM

## 2016-01-10 DIAGNOSIS — IMO0002 Reserved for concepts with insufficient information to code with codable children: Secondary | ICD-10-CM

## 2016-01-10 MED ORDER — SUMATRIPTAN SUCCINATE 50 MG PO TABS: 50.0000 mg | ORAL_TABLET | ORAL | 11 refills | Status: DC | PRN

## 2016-01-10 MED ORDER — TOPIRAMATE 100 MG PO TABS: 100.0000 mg | ORAL_TABLET | Freq: Two times a day (BID) | ORAL | 11 refills | Status: DC

## 2016-01-10 NOTE — Progress Notes (Signed)
PATIENT: [name] DOB: 05-May-1965  Chief Complaint  Patient presents with  . Migraine    Feels her migraines have only slightly improved since starting Topamax 50mg , qhs.  Her only reported side effect of the medication is mild, intermittent tingling her her fingertips and lips.  Sumatriptan has worked well to resolve her more severe pain.  Marland Kitchen Possible bilateral papillary edema    She would like to review her MRI results.     HISTORICAL  [name], seen in refer by  Harlen Labs, MD, her primary care physician is Dr. Toma Deiters evaluation was November 08 2015.  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 Marin Comment since 2014, I reviewed and summarized in note from Dr. Marin Comment on October 06 2015, there was a concern of moderate bilateral papillary edema no evidence of visual field loss, bilateral optic disc 0.2/0.2 cup-to-disc ratio,   She had a history of migraine since 2016, described as ocular migraine, often started with visual distortion, sometimes loss of peripheral visual field, followed by behind eye pain, since August 2017, she began to have daily ocular migraines, blurry vision after  staring at her computer for a few hours, behind eye pain, blurry vision, floating travels through her eye field, mild retro-orbital area headache. She has been taking ibuprofen on a daily basis every 6 hours  She also complains frequent dizziness, transient, sometimes associated with her headaches, can happen in standing or sitting down position,  UPDATE Dec 6th 2017: She is now taking Topamax 25 mg 2 tablets every night, cause paresthesia around her fingertips and around her mouth, she used sumatriptan 25 mg on almost daily bases, which does help her headaches.  Her typical migraine  started at her eyes then evolved into holo-cranial headache 10/10, lasting 2-3 hours,  I personally reviewed MRI of the brain without contrast November 2017: No significant abnormality MRI of the cervical and lumbar spine showed mild degenerative changes, no significant canal or foraminal stenosis  REVIEW OF SYSTEMS: Full 14 system review of systems performed and notable only for eye itching, light sensitivity, eye pain, blurry vision, excessive thirst, flushing, headaches  ALLERGIES: Allergies  Allergen Reactions  . Azo [Phenazopyridine] Other (See Comments)    Facial swelling, hives, chest tightness.  . Morphine And Related     Dizziness, Vomiting  . Sulfa Antibiotics Rash    HOME MEDICATIONS: Current Outpatient Prescriptions  Medication Sig Dispense Refill  . ALPRAZolam (XANAX) 0.5 MG tablet Take 1-2 tablets thirty minutes prior to MRI.  May take one additional tablet before entering scanner, if needed.  MUST HAVE DRIVER. 3 tablet 0  . amLODipine (NORVASC) 5 MG tablet Take 5 mg by mouth daily.    . Cholecalciferol (VITAMIN D3) 5000 units TABS Take by mouth daily.    . insulin NPH-regular Human (NOVOLIN 70/30) (70-30) 100 UNIT/ML injection Inject 100 Units into the skin daily.    . Magnesium 500 MG TABS Take by mouth daily.    . meclizine (ANTIVERT) 25 MG tablet Take 25 mg by mouth 3 (three) times daily as needed for dizziness.    . methocarbamol (ROBAXIN) 500 MG tablet Take 1 tablet (500 mg total) by mouth 4 (four) times daily. 16 tablet 0  . metoprolol (LOPRESSOR) 50 MG tablet daily.    Marland Kitchen  simvastatin (ZOCOR) 20 MG tablet Take 20 mg by mouth daily.    . SUMAtriptan (IMITREX) 25 MG tablet Take 1 tablet (25 mg total) by mouth every 2 (two) hours as needed for migraine. May repeat in 2 hours if headache persists or recurs. 12 tablet 6  . topiramate (TOPAMAX) 25 MG tablet Take 2 tablets (50 mg total) by mouth 2 (two) times daily. 360 tablet 1  . valsartan-hydrochlorothiazide  (DIOVAN-HCT) 320-12.5 MG tablet Take 1 tablet by mouth daily.    Marland Kitchen XIIDRA 5 % SOLN 2 (two) times daily.     No current facility-administered medications for this visit.     PAST MEDICAL HISTORY: Past Medical History:  Diagnosis Date  . Colitis, ulcerative ([place])   . Diabetes mellitus   . Hyperlipidemia   . Hypertension   . Migraine   . Obesity   . Optic nerve edema   . Ulcerative colitis ([place])     PAST SURGICAL HISTORY: Past Surgical History:  Procedure Laterality Date  . ABDOMINAL HYSTERECTOMY    . CARPAL TUNNEL RELEASE    . CHOLECYSTECTOMY    . HERNIA REPAIR    . LIPOMA EXCISION      FAMILY HISTORY: Family History  Problem Relation Age of Onset  . Cancer Other   . Hypertension Other   . Hyperlipidemia Other   . Heart attack Other   . Cancer Mother     Colorectal  . Heart disease Father   . Heart attack Father   . Cancer Sister     Colorectal  . Cancer Brother     Colorectal  . Heart disease Brother   . Heart attack Brother   . Heart disease Paternal Grandmother   . Heart disease Paternal Grandfather     SOCIAL HISTORY:  Social History   Social History  . Marital status: Married    Spouse name: N/A  . Number of children: 2  . Years of education: Associates   Occupational History  . Nurse    Social History Main Topics  . Smoking status: Former Smoker    Quit date: 1998  . Smokeless tobacco: Never Used  . Alcohol use Yes     Comment: Occasionally  . Drug use: No  . Sexual activity: Not on file   Other Topics Concern  . Not on file   Social History Narrative   Lives at home with husband.   Right-handed.   Occasional use of caffeine.     PHYSICAL EXAM   Vitals:   01/10/16 1131  BP: (!) 143/79  Pulse: 72  Weight: 241 lb 4 oz (109.4 kg)  Height: 5\' 7"  (1.702 m)    Not recorded      Body mass index is 37.79 kg/m.  PHYSICAL EXAMNIATION:  Gen: NAD, conversant, well nourised, obese, well groomed                       Cardiovascular: Regular rate rhythm, no peripheral edema, warm, nontender. Eyes: Conjunctivae clear without exudates or hemorrhage Neck: Supple, no carotid bruise. Pulmonary: Clear to auscultation bilaterally   NEUROLOGICAL EXAM:  MENTAL STATUS: Speech:    Speech is normal; fluent and spontaneous with normal comprehension.  Cognition:     Orientation to time, place and person     Normal recent and remote memory     Normal Attention span and concentration     Normal Language, naming, repeating,spontaneous speech     Fund of knowledge  CRANIAL NERVES: CN II: Visual fields are full to confrontation. Fundoscopic exam is normal with sharp discs and no vascular changes. Pupils are round equal and briskly reactive to light. CN III, IV, VI: extraocular movement are normal. No ptosis. CN V: Facial sensation is intact to pinprick in all 3 divisions bilaterally. Corneal responses are intact.  CN VII: Face is symmetric with normal eye closure and smile. CN VIII: Hearing is normal to rubbing fingers CN IX, X: Palate elevates symmetrically. Phonation is normal. CN XI: Head turning and shoulder shrug are intact CN XII: Tongue is midline with normal movements and no atrophy.  MOTOR: There is no pronator drift of out-stretched arms. Muscle bulk and tone are normal. Muscle strength is normal.  REFLEXES: Reflexes are 2+ and symmetric at the biceps, triceps, knees, and ankles. Plantar responses are flexor.  SENSORY: Intact to light touch, pinprick, positional sensation and vibratory sensation are intact in fingers and toes.  COORDINATION: Rapid alternating movements and fine finger movements are intact. There is no dysmetria on finger-to-nose and heel-knee-shin.    GAIT/STANCE: Posture is normal. Gait is steady with normal steps, base, arm swing, and turning. Heel and toe walking are normal. Tandem gait is normal.  Romberg is absent.   DIAGNOSTIC DATA (LABS, IMAGING, TESTING) - I reviewed  patient records, labs, notes, testing and imaging myself where available.   ASSESSMENT AND PLAN  [name] is a 51 y.o. female   Chronic migraine headaches,   likely a component of medicine rebound headache, she has been taking ibuprofen every 6 hours I have suggested her to stop the daily analgesic use Increase Topamax to 100 mg twice a day Imitrex 50 mg as needed for moderate to severe headache, Try to identify and avoid triggers   Marcial Pacas, M.D. Ph.D.  [facility] [address] [place], [place] [address] Ph: [phone] Fax: [phone]  CC: Adelina Mings Margret Chance, MD, her primary care physician is Dr. Glendale Chard

## 2016-02-14 DIAGNOSIS — M25511 Pain in right shoulder: Secondary | ICD-10-CM | POA: Diagnosis not present

## 2016-04-01 DIAGNOSIS — I10 Essential (primary) hypertension: Secondary | ICD-10-CM | POA: Diagnosis not present

## 2016-04-01 DIAGNOSIS — E1165 Type 2 diabetes mellitus with hyperglycemia: Secondary | ICD-10-CM | POA: Diagnosis not present

## 2016-04-01 DIAGNOSIS — M79643 Pain in unspecified hand: Secondary | ICD-10-CM | POA: Diagnosis not present

## 2016-04-11 ENCOUNTER — Ambulatory Visit: Payer: 59 | Admitting: Nurse Practitioner

## 2016-04-29 DIAGNOSIS — R300 Dysuria: Secondary | ICD-10-CM | POA: Diagnosis not present

## 2016-05-13 DIAGNOSIS — J302 Other seasonal allergic rhinitis: Secondary | ICD-10-CM | POA: Diagnosis not present

## 2016-06-06 DIAGNOSIS — M25572 Pain in left ankle and joints of left foot: Secondary | ICD-10-CM | POA: Diagnosis not present

## 2016-06-06 DIAGNOSIS — M79641 Pain in right hand: Secondary | ICD-10-CM | POA: Diagnosis not present

## 2016-06-06 DIAGNOSIS — M25571 Pain in right ankle and joints of right foot: Secondary | ICD-10-CM | POA: Diagnosis not present

## 2016-06-06 DIAGNOSIS — M25541 Pain in joints of right hand: Secondary | ICD-10-CM | POA: Diagnosis not present

## 2016-06-06 DIAGNOSIS — M25542 Pain in joints of left hand: Secondary | ICD-10-CM | POA: Diagnosis not present

## 2016-07-12 ENCOUNTER — Other Ambulatory Visit: Payer: Self-pay | Admitting: Internal Medicine

## 2016-07-12 DIAGNOSIS — Z1231 Encounter for screening mammogram for malignant neoplasm of breast: Secondary | ICD-10-CM

## 2016-07-17 DIAGNOSIS — I10 Essential (primary) hypertension: Secondary | ICD-10-CM | POA: Diagnosis not present

## 2016-07-17 DIAGNOSIS — Z1389 Encounter for screening for other disorder: Secondary | ICD-10-CM | POA: Diagnosis not present

## 2016-07-17 DIAGNOSIS — M65311 Trigger thumb, right thumb: Secondary | ICD-10-CM | POA: Diagnosis not present

## 2016-07-17 DIAGNOSIS — E1165 Type 2 diabetes mellitus with hyperglycemia: Secondary | ICD-10-CM | POA: Diagnosis not present

## 2016-07-17 DIAGNOSIS — M65341 Trigger finger, right ring finger: Secondary | ICD-10-CM | POA: Diagnosis not present

## 2016-07-17 DIAGNOSIS — J309 Allergic rhinitis, unspecified: Secondary | ICD-10-CM | POA: Diagnosis not present

## 2016-07-17 DIAGNOSIS — M65312 Trigger thumb, left thumb: Secondary | ICD-10-CM | POA: Diagnosis not present

## 2016-08-08 ENCOUNTER — Emergency Department (HOSPITAL_COMMUNITY)
Admission: EM | Admit: 2016-08-08 | Discharge: 2016-08-08 | Disposition: A | Discharge status: Home / Self Care | Payer: 59 | Attending: Emergency Medicine | Admitting: Emergency Medicine

## 2016-08-08 ENCOUNTER — Encounter (HOSPITAL_COMMUNITY): Payer: Self-pay

## 2016-08-08 DIAGNOSIS — S80261A Insect bite (nonvenomous), right knee, initial encounter: Secondary | ICD-10-CM | POA: Diagnosis not present

## 2016-08-08 DIAGNOSIS — L03115 Cellulitis of right lower limb: Secondary | ICD-10-CM | POA: Diagnosis not present

## 2016-08-08 DIAGNOSIS — E119 Type 2 diabetes mellitus without complications: Secondary | ICD-10-CM | POA: Insufficient documentation

## 2016-08-08 DIAGNOSIS — Z79899 Other long term (current) drug therapy: Secondary | ICD-10-CM | POA: Diagnosis not present

## 2016-08-08 DIAGNOSIS — Z794 Long term (current) use of insulin: Secondary | ICD-10-CM | POA: Diagnosis not present

## 2016-08-08 DIAGNOSIS — Y999 Unspecified external cause status: Secondary | ICD-10-CM | POA: Insufficient documentation

## 2016-08-08 DIAGNOSIS — Y939 Activity, unspecified: Secondary | ICD-10-CM | POA: Diagnosis not present

## 2016-08-08 DIAGNOSIS — Z87891 Personal history of nicotine dependence: Secondary | ICD-10-CM | POA: Diagnosis not present

## 2016-08-08 DIAGNOSIS — Y929 Unspecified place or not applicable: Secondary | ICD-10-CM | POA: Diagnosis not present

## 2016-08-08 DIAGNOSIS — M25561 Pain in right knee: Secondary | ICD-10-CM | POA: Diagnosis present

## 2016-08-08 DIAGNOSIS — I10 Essential (primary) hypertension: Secondary | ICD-10-CM | POA: Insufficient documentation

## 2016-08-08 DIAGNOSIS — W57XXXA Bitten or stung by nonvenomous insect and other nonvenomous arthropods, initial encounter: Secondary | ICD-10-CM | POA: Insufficient documentation

## 2016-08-08 MED ORDER — DOXYCYCLINE HYCLATE 100 MG PO TABS
100.0000 mg | ORAL_TABLET | Freq: Two times a day (BID) | ORAL | Status: DC
  Administered 2016-08-08: 100 mg via ORAL
  Filled 2016-08-08: qty 1

## 2016-08-08 MED ORDER — DOXYCYCLINE HYCLATE 100 MG PO CAPS: 100.0000 mg | ORAL_CAPSULE | Freq: Two times a day (BID) | ORAL | 0 refills | Status: AC

## 2016-08-08 NOTE — ED Provider Notes (Signed)
Medical screening examination/treatment/procedure(s) were conducted as a shared visit with non-physician practitioner(s) and myself.  I personally evaluated the patient during the encounter.   EKG Interpretation None       Patient seen by me along with physician assistant. Patient states that about 3 days ago she got a bite behind her right knee. Now has an area of redness increased warmth slight itching some pain. Area of redness measures about 5 x 3 cm. There is no fluctuance. Small area of induration clearly a central area that appears to be a bite. Patient did not pull a tick off of her. Patient without allergies to tetracyclines. Would recommend a 7 day course of doxycycline.  Patient without any abdominal pain fevers nausea vomiting chest pain shortness of breath. Does not seem to be consistent with a black widow spider bite.   Fredia Sorrow, MD 08/08/16 [id number]

## 2016-08-08 NOTE — ED Triage Notes (Signed)
Pt has possible insect bite to back of right leg, behind knee. For approx 3 days. Area is red and warm to touch

## 2016-08-08 NOTE — Discharge Instructions (Signed)
Please take all of your antibiotics until finished!   You may develop abdominal discomfort or diarrhea from the antibiotic.  You may help offset this with probiotics which you can buy or get in yogurt. Do not eat  or take the probiotics until 2 hours after your antibiotic. You may apply ice packs or warm compresses to the area for comfort. Follow-up with your primary care physician for reevaluation this week. Return to the ED immediately if any concerning signs or symptoms develop such as fever, chills, drainage, worsening/spreading redness.

## 2016-08-08 NOTE — ED Provider Notes (Signed)
[facility] DEPT Provider Note   CSN: [id number] Arrival date & time: 08/08/16  1619     History   Chief Complaint Chief Complaint  Patient presents with  . Insect Bite    HPI [name] is a 51 y.o. female with history of UC, DM, HLD, HTN, migraines who presents a with chief complaint acute onset, progressively worsening pain and erythema to the posterior right knee. She notes that she awoke 3 days ago to find "a pinpoint area of redness" which she believed to be an insect bite. The area was noted to initially be pruritic, but this has resolved. She now notes worsening spreading erythema, warmth, and tenderness. Pain worse with palpation and walking. Alleviated with laying down somewhat. Denies numbness, tingling, weakness, fevers, or chills. Pain does not radiate. She has not tried anything for her symptoms. She denies recent travel or surgeries, hemoptysis, prior history of DVT or PE, estrogen therapy, or history of cancer. She is a former smoker.  The history is provided by the patient.    Past Medical History:  Diagnosis Date  . Colitis, ulcerative ([place])   . Diabetes mellitus   . Hyperlipidemia   . Hypertension   . Migraine   . Obesity   . Optic nerve edema   . Ulcerative colitis [facility])     Patient Active Problem List   Diagnosis Date Noted  . Chronic migraine 11/08/2015  . Morbid obesity ([place]) 11/08/2015    Past Surgical History:  Procedure Laterality Date  . ABDOMINAL HYSTERECTOMY    . CARPAL TUNNEL RELEASE    . CHOLECYSTECTOMY    . HERNIA REPAIR    . LIPOMA EXCISION      OB History    No data available       Home Medications    Prior to Admission medications   Medication Sig Start Date End Date Taking? Authorizing Provider  amLODipine (NORVASC) 5 MG tablet Take 5 mg by mouth daily.   Yes [provider]  fluticasone (FLONASE) 50 MCG/ACT nasal spray Place 1-2 sprays into both nostrils daily as needed for allergies. 06/09/16  Yes [provider]  insulin NPH-regular Human (NOVOLIN 70/30) (70-30) 100 UNIT/ML injection Inject 100 Units into the skin daily.   Yes [provider]  ipratropium (ATROVENT) 0.06 % nasal spray Place 2 sprays into both nostrils 3 (three) times daily. 3-4 times daily for nasal congestion. 06/04/16  Yes [provider]  lidocaine (LIDODERM) 5 % Place 1 patch onto the skin daily as needed for pain. 06/26/16  Yes [provider]  Magnesium 500 MG TABS Take by mouth daily.   Yes [provider]  meclizine (ANTIVERT) 25 MG tablet Take 25 mg by mouth 3 (three) times daily as needed for dizziness.   Yes [provider]  metoprolol (LOPRESSOR) 50 MG tablet daily. 10/28/15  Yes [provider]  simvastatin (ZOCOR) 20 MG tablet Take 20 mg by mouth daily.   Yes [provider]  SUMAtriptan (IMITREX) 50 MG tablet Take 1 tablet (50 mg total) by mouth every 2 (two) hours as needed for migraine. May repeat in 2 hours if headache persists or recurs. 01/10/16  Yes Marcial Pacas, MD  valsartan-hydrochlorothiazide (DIOVAN-HCT) 320-12.5 MG tablet Take 1 tablet by mouth daily.   Yes [provider]  XIIDRA 5 % SOLN 2 (two) times daily. 10/07/15  Yes [provider]  ALPRAZolam Duanne Moron) 0.5 MG tablet Take 1-2 tablets thirty minutes prior to MRI.  May take one additional tablet before entering scanner, if needed.  MUST HAVE DRIVER. 32/6/71   Marcial Pacas, MD  doxycycline (VIBRAMYCIN) 100 MG capsule Take 1 capsule (100 mg total) by mouth 2 (two) times daily. 08/08/16 08/15/16  Renita Papa, PA-C    Family History Family History  Problem Relation Age of Onset  . Cancer Other   . Hypertension Other   . Hyperlipidemia Other   . Heart attack Other   . Cancer Mother        Colorectal  . Heart disease Father   . Heart attack Father   . Cancer Sister        Colorectal  . Cancer Brother        Colorectal  . Heart disease Brother   . Heart attack Brother     . Heart disease Paternal Grandmother   . Heart disease Paternal Grandfather     Social History Social History  Substance Use Topics  . Smoking status: Former Smoker    Quit date: 1998  . Smokeless tobacco: Never Used  . Alcohol use No     Comment: Occasionally     Allergies   Azo [phenazopyridine]; Morphine and related; and Sulfa antibiotics   Review of Systems Review of Systems  Constitutional: Negative for chills and fever.  Respiratory: Negative for cough.   Cardiovascular: Negative for chest pain.  Gastrointestinal: Negative for abdominal pain.  Musculoskeletal: Negative for arthralgias and myalgias.  Skin: Positive for color change.  Neurological: Negative for weakness and numbness.     Physical Exam Updated Vital Signs BP 137/84   Pulse 72   Temp 98.2 F (36.8 C) (Oral)   Resp 18   Ht 5\' 7"  (1.702 m)   Wt 111.1 kg (245 lb)   SpO2 97%   BMI 38.37 kg/m   Physical Exam  Constitutional: She appears well-developed and well-nourished. No distress.  HENT:  Head: Normocephalic and atraumatic.  Eyes: Conjunctivae are normal. Right eye exhibits no discharge. Left eye exhibits no discharge.  Neck: No JVD present. No tracheal deviation present.  Cardiovascular: Normal rate and intact distal pulses.   2+ DP/PT pulses bl, negative Homan's bl, no palpable cords, no LE edema   Pulmonary/Chest: Effort normal.  Abdominal: She exhibits no distension.  Musculoskeletal: She exhibits no edema.  Full AROM of right knee, no deformity or crepitus noted. No varus or valgus deformity, no ligamentous laxity. 5/5 strength of BLE major muscle groups  Neurological: She is alert.  Fluent speech, no facial droop, sensation intact to soft touch of BLE  Skin: Skin is warm and dry. Capillary refill takes less than 2 seconds. No erythema.  13 cm x 4 cm elliptical area of erythema to the posterior right knee. No significant fluctuance. No bleeding or drainage noted. There is a small 13mm  area of induration centrally. Tender to palpation.  Psychiatric: She has a normal mood and affect. Her behavior is normal.  Nursing note and vitals reviewed.    ED Treatments / Results  Labs (all labs ordered are listed, but only abnormal results are displayed) Labs Reviewed - No data to display  EKG  EKG Interpretation None       Radiology No results found.  Procedures Procedures (including critical care time)  Medications Ordered in ED Medications  doxycycline (VIBRA-TABS) tablet 100 mg (100 mg Oral Given 08/08/16 1909)     Initial Impression / Assessment and Plan / ED Course  I have reviewed the triage vital  signs and the nursing notes.  Pertinent labs & imaging results that were available during my care of the patient were reviewed by me and considered in my medical decision making (see chart for details).     Patient with worsening erythema and tenderness to the right popliteal fossa. No swelling, or fluctuance noted. Central area of induration  appears to likely be from insect bite, subsequently resulting in surrounding cellulitis. Afebrile, vital signs are stable. Low suspicion of DVT in the absence of risk factors. low suspicion of Lyme's disease, [place] spotted fever, or necrotizing fasciitis. Patient seen and evaluated by Dr. Rogene Houston, who agrees with assessment and plan. first dose doxycycline given in the ED. She is stable for discharge home with 7 day course of doxycycline, and follow up with primary care for reevaluation. Discussed indications for return to the ED immediately, including development of fevers, chills, or worsening or spreading redness. Pt verbalized understanding of and agreement with plan and is safe for discharge home at this time.   Final Clinical Impressions(s) / ED Diagnoses   Final diagnoses:  Insect bite, initial encounter  Cellulitis of right lower extremity    New Prescriptions Discharge Medication List as of 08/08/2016  6:55  PM    START taking these medications   Details  doxycycline (VIBRAMYCIN) 100 MG capsule Take 1 capsule (100 mg total) by mouth 2 (two) times daily., Starting Thu 08/08/2016, Until Thu 08/15/2016, Print         Nils Flack, [place] A, PA-C 08/08/16 1925    Fredia Sorrow, MD 08/15/16 [phone]

## 2016-08-28 ENCOUNTER — Ambulatory Visit
Admission: RE | Admit: 2016-08-28 | Discharge: 2016-08-28 | Disposition: A | Discharge status: Home / Self Care | Payer: 59 | Source: Ambulatory Visit | Attending: Internal Medicine | Admitting: Internal Medicine

## 2016-08-28 DIAGNOSIS — M65312 Trigger thumb, left thumb: Secondary | ICD-10-CM | POA: Diagnosis not present

## 2016-08-28 DIAGNOSIS — Z1231 Encounter for screening mammogram for malignant neoplasm of breast: Secondary | ICD-10-CM

## 2016-08-28 DIAGNOSIS — G5603 Carpal tunnel syndrome, bilateral upper limbs: Secondary | ICD-10-CM | POA: Diagnosis not present

## 2016-08-28 DIAGNOSIS — M65311 Trigger thumb, right thumb: Secondary | ICD-10-CM | POA: Diagnosis not present

## 2016-08-28 DIAGNOSIS — M542 Cervicalgia: Secondary | ICD-10-CM | POA: Diagnosis not present

## 2016-08-28 DIAGNOSIS — M65341 Trigger finger, right ring finger: Secondary | ICD-10-CM | POA: Diagnosis not present

## 2016-09-04 DIAGNOSIS — M65311 Trigger thumb, right thumb: Secondary | ICD-10-CM

## 2016-09-04 HISTORY — DX: Trigger thumb, right thumb: M65.311

## 2016-09-11 DIAGNOSIS — M542 Cervicalgia: Secondary | ICD-10-CM | POA: Diagnosis not present

## 2016-09-11 DIAGNOSIS — M65312 Trigger thumb, left thumb: Secondary | ICD-10-CM | POA: Diagnosis not present

## 2016-09-11 DIAGNOSIS — M65311 Trigger thumb, right thumb: Secondary | ICD-10-CM | POA: Diagnosis not present

## 2016-09-11 DIAGNOSIS — G5603 Carpal tunnel syndrome, bilateral upper limbs: Secondary | ICD-10-CM | POA: Diagnosis not present

## 2016-09-11 DIAGNOSIS — M65341 Trigger finger, right ring finger: Secondary | ICD-10-CM | POA: Diagnosis not present

## 2016-09-12 ENCOUNTER — Other Ambulatory Visit: Payer: Self-pay | Admitting: Orthopedic Surgery

## 2016-09-27 ENCOUNTER — Encounter (HOSPITAL_BASED_OUTPATIENT_CLINIC_OR_DEPARTMENT_OTHER): Payer: Self-pay | Admitting: *Deleted

## 2016-09-27 NOTE — Pre-Procedure Instructions (Signed)
To go to [facility] for BMET 

## 2016-10-01 ENCOUNTER — Encounter (HOSPITAL_COMMUNITY)
Admission: RE | Admit: 2016-10-01 | Discharge: 2016-10-01 | Disposition: A | Discharge status: Home / Self Care | Payer: 59 | Source: Ambulatory Visit | Attending: Orthopedic Surgery | Admitting: Orthopedic Surgery

## 2016-10-01 DIAGNOSIS — Z794 Long term (current) use of insulin: Secondary | ICD-10-CM | POA: Diagnosis not present

## 2016-10-01 DIAGNOSIS — Z79899 Other long term (current) drug therapy: Secondary | ICD-10-CM | POA: Diagnosis not present

## 2016-10-01 DIAGNOSIS — Z885 Allergy status to narcotic agent status: Secondary | ICD-10-CM | POA: Diagnosis not present

## 2016-10-01 DIAGNOSIS — E119 Type 2 diabetes mellitus without complications: Secondary | ICD-10-CM | POA: Diagnosis not present

## 2016-10-01 DIAGNOSIS — E785 Hyperlipidemia, unspecified: Secondary | ICD-10-CM | POA: Diagnosis not present

## 2016-10-01 DIAGNOSIS — Z87891 Personal history of nicotine dependence: Secondary | ICD-10-CM | POA: Diagnosis not present

## 2016-10-01 DIAGNOSIS — Z881 Allergy status to other antibiotic agents status: Secondary | ICD-10-CM | POA: Diagnosis not present

## 2016-10-01 DIAGNOSIS — I10 Essential (primary) hypertension: Secondary | ICD-10-CM | POA: Diagnosis not present

## 2016-10-01 DIAGNOSIS — Z800 Family history of malignant neoplasm of digestive organs: Secondary | ICD-10-CM | POA: Diagnosis not present

## 2016-10-01 DIAGNOSIS — Z8249 Family history of ischemic heart disease and other diseases of the circulatory system: Secondary | ICD-10-CM | POA: Diagnosis not present

## 2016-10-01 DIAGNOSIS — Z6838 Body mass index (BMI) 38.0-38.9, adult: Secondary | ICD-10-CM | POA: Diagnosis not present

## 2016-10-01 DIAGNOSIS — Z882 Allergy status to sulfonamides status: Secondary | ICD-10-CM | POA: Diagnosis not present

## 2016-10-01 DIAGNOSIS — E669 Obesity, unspecified: Secondary | ICD-10-CM | POA: Diagnosis not present

## 2016-10-01 DIAGNOSIS — M65311 Trigger thumb, right thumb: Secondary | ICD-10-CM | POA: Diagnosis not present

## 2016-10-01 LAB — BASIC METABOLIC PANEL
Anion gap: 10 (ref 5–15)
BUN: 8 mg/dL (ref 6–20)
CHLORIDE: 103 mmol/L (ref 101–111)
CO2: 23 mmol/L (ref 22–32)
Calcium: 9.2 mg/dL (ref 8.9–10.3)
Creatinine, Ser: 0.61 mg/dL (ref 0.44–1.00)
GFR calc Af Amer: >60 mL/min (ref >60)
GFR calc non Af Amer: >60 mL/min (ref >60)
Glucose, Bld: 306 mg/dL — ABNORMAL HIGH (ref 65–99)
POTASSIUM: 3.8 mmol/L (ref 3.5–5.1)
SODIUM: 136 mmol/L (ref 135–145)

## 2016-10-01 NOTE — Progress Notes (Signed)
Lab results reviewed by Dr. Fransisco Beau, Georgana Curio, pt needs to follow up with primary care physician before surgery. Notified Robin at [facility], states she will contact pt and let her know.

## 2016-10-03 ENCOUNTER — Encounter (HOSPITAL_BASED_OUTPATIENT_CLINIC_OR_DEPARTMENT_OTHER): Payer: Self-pay | Admitting: Anesthesiology

## 2016-10-03 ENCOUNTER — Encounter (HOSPITAL_BASED_OUTPATIENT_CLINIC_OR_DEPARTMENT_OTHER)
Admission: RE | Disposition: A | Discharge status: Home / Self Care | Payer: Self-pay | Source: Ambulatory Visit | Attending: Orthopedic Surgery

## 2016-10-03 ENCOUNTER — Ambulatory Visit (HOSPITAL_BASED_OUTPATIENT_CLINIC_OR_DEPARTMENT_OTHER)
Admission: RE | Admit: 2016-10-03 | Discharge: 2016-10-03 | Disposition: A | Discharge status: Home / Self Care | Payer: 59 | Source: Ambulatory Visit | Attending: Orthopedic Surgery | Admitting: Orthopedic Surgery

## 2016-10-03 ENCOUNTER — Ambulatory Visit (HOSPITAL_BASED_OUTPATIENT_CLINIC_OR_DEPARTMENT_OTHER): Payer: 59 | Admitting: Anesthesiology

## 2016-10-03 DIAGNOSIS — Z87891 Personal history of nicotine dependence: Secondary | ICD-10-CM | POA: Insufficient documentation

## 2016-10-03 DIAGNOSIS — Z8249 Family history of ischemic heart disease and other diseases of the circulatory system: Secondary | ICD-10-CM | POA: Insufficient documentation

## 2016-10-03 DIAGNOSIS — Z79899 Other long term (current) drug therapy: Secondary | ICD-10-CM | POA: Insufficient documentation

## 2016-10-03 DIAGNOSIS — Z794 Long term (current) use of insulin: Secondary | ICD-10-CM | POA: Insufficient documentation

## 2016-10-03 DIAGNOSIS — G43909 Migraine, unspecified, not intractable, without status migrainosus: Secondary | ICD-10-CM | POA: Diagnosis not present

## 2016-10-03 DIAGNOSIS — I10 Essential (primary) hypertension: Secondary | ICD-10-CM | POA: Insufficient documentation

## 2016-10-03 DIAGNOSIS — E119 Type 2 diabetes mellitus without complications: Secondary | ICD-10-CM | POA: Insufficient documentation

## 2016-10-03 DIAGNOSIS — Z885 Allergy status to narcotic agent status: Secondary | ICD-10-CM | POA: Insufficient documentation

## 2016-10-03 DIAGNOSIS — Z882 Allergy status to sulfonamides status: Secondary | ICD-10-CM | POA: Insufficient documentation

## 2016-10-03 DIAGNOSIS — E785 Hyperlipidemia, unspecified: Secondary | ICD-10-CM | POA: Insufficient documentation

## 2016-10-03 DIAGNOSIS — E669 Obesity, unspecified: Secondary | ICD-10-CM | POA: Insufficient documentation

## 2016-10-03 DIAGNOSIS — Z6838 Body mass index (BMI) 38.0-38.9, adult: Secondary | ICD-10-CM | POA: Insufficient documentation

## 2016-10-03 DIAGNOSIS — M65311 Trigger thumb, right thumb: Secondary | ICD-10-CM | POA: Diagnosis not present

## 2016-10-03 DIAGNOSIS — Z800 Family history of malignant neoplasm of digestive organs: Secondary | ICD-10-CM | POA: Insufficient documentation

## 2016-10-03 DIAGNOSIS — Z881 Allergy status to other antibiotic agents status: Secondary | ICD-10-CM | POA: Insufficient documentation

## 2016-10-03 HISTORY — DX: Palpitations: R00.2

## 2016-10-03 HISTORY — PX: TRIGGER FINGER RELEASE: SHX641

## 2016-10-03 HISTORY — DX: Migraine, unspecified, not intractable, without status migrainosus: G43.909

## 2016-10-03 HISTORY — DX: Type 2 diabetes mellitus without complications: E11.9

## 2016-10-03 HISTORY — DX: Dental restoration status: Z98.811

## 2016-10-03 HISTORY — DX: Long term (current) use of insulin: Z79.4

## 2016-10-03 HISTORY — DX: Trigger thumb, right thumb: M65.311

## 2016-10-03 HISTORY — DX: Claustrophobia: F40.240

## 2016-10-03 HISTORY — DX: Reserved for inherently not codable concepts without codable children: IMO0001

## 2016-10-03 LAB — GLUCOSE, CAPILLARY
GLUCOSE-CAPILLARY: 123 mg/dL — AB (ref 65–99)
GLUCOSE-CAPILLARY: 130 mg/dL — AB (ref 65–99)

## 2016-10-03 SURGERY — RELEASE, A1 PULLEY, FOR TRIGGER FINGER
Anesthesia: Regional | Site: Thumb | Laterality: Right

## 2016-10-03 MED ORDER — CEFAZOLIN SODIUM-DEXTROSE 2-4 GM/100ML-% IV SOLN: 2.0000 g | INTRAVENOUS | Status: DC

## 2016-10-03 MED ORDER — CEFAZOLIN SODIUM-DEXTROSE 2-4 GM/100ML-% IV SOLN
INTRAVENOUS | Status: AC
  Filled 2016-10-03: qty 100

## 2016-10-03 MED ORDER — MIDAZOLAM HCL 5 MG/5ML IJ SOLN
INTRAMUSCULAR | Status: DC | PRN
  Administered 2016-10-03: 2 mg via INTRAVENOUS

## 2016-10-03 MED ORDER — PROPOFOL 10 MG/ML IV BOLUS
INTRAVENOUS | Status: AC
  Filled 2016-10-03: qty 20

## 2016-10-03 MED ORDER — ONDANSETRON HCL 4 MG/2ML IJ SOLN
INTRAMUSCULAR | Status: AC
  Filled 2016-10-03: qty 2

## 2016-10-03 MED ORDER — PROPOFOL 10 MG/ML IV BOLUS
INTRAVENOUS | Status: DC | PRN
  Administered 2016-10-03: 150 mg via INTRAVENOUS

## 2016-10-03 MED ORDER — MEPERIDINE HCL 25 MG/ML IJ SOLN: 6.2500 mg | INTRAMUSCULAR | Status: DC | PRN

## 2016-10-03 MED ORDER — SCOPOLAMINE 1 MG/3DAYS TD PT72: 1.0000 | MEDICATED_PATCH | Freq: Once | TRANSDERMAL | Status: DC | PRN

## 2016-10-03 MED ORDER — MIDAZOLAM HCL 2 MG/2ML IJ SOLN
INTRAMUSCULAR | Status: AC
  Filled 2016-10-03: qty 2

## 2016-10-03 MED ORDER — TRAMADOL HCL 50 MG PO TABS: 50.0000 mg | ORAL_TABLET | Freq: Once | ORAL | Status: DC

## 2016-10-03 MED ORDER — CHLORHEXIDINE GLUCONATE 4 % EX LIQD: 60.0000 mL | Freq: Once | CUTANEOUS | Status: DC

## 2016-10-03 MED ORDER — BUPIVACAINE HCL 0.5 % IJ SOLN
INTRAMUSCULAR | Status: DC | PRN
  Administered 2016-10-03: 7 mL

## 2016-10-03 MED ORDER — BUPIVACAINE HCL (PF) 0.5 % IJ SOLN
INTRAMUSCULAR | Status: AC
  Filled 2016-10-03: qty 30

## 2016-10-03 MED ORDER — DEXAMETHASONE SODIUM PHOSPHATE 10 MG/ML IJ SOLN
INTRAMUSCULAR | Status: AC
  Filled 2016-10-03: qty 1

## 2016-10-03 MED ORDER — LACTATED RINGERS IV SOLN
INTRAVENOUS | Status: DC
  Administered 2016-10-03: 08:00:00 via INTRAVENOUS

## 2016-10-03 MED ORDER — PROMETHAZINE HCL 25 MG/ML IJ SOLN
INTRAMUSCULAR | Status: AC
  Filled 2016-10-03: qty 1

## 2016-10-03 MED ORDER — LACTATED RINGERS IV SOLN
INTRAVENOUS | Status: DC | PRN
  Administered 2016-10-03: 08:00:00 via INTRAVENOUS

## 2016-10-03 MED ORDER — FENTANYL CITRATE (PF) 100 MCG/2ML IJ SOLN
INTRAMUSCULAR | Status: AC
  Filled 2016-10-03: qty 2

## 2016-10-03 MED ORDER — LIDOCAINE 2% (20 MG/ML) 5 ML SYRINGE
INTRAMUSCULAR | Status: AC
  Filled 2016-10-03: qty 5

## 2016-10-03 MED ORDER — BUPIVACAINE-EPINEPHRINE (PF) 0.25% -1:200000 IJ SOLN
INTRAMUSCULAR | Status: AC
  Filled 2016-10-03: qty 30

## 2016-10-03 MED ORDER — LIDOCAINE HCL (CARDIAC) 20 MG/ML IV SOLN
INTRAVENOUS | Status: DC | PRN
  Administered 2016-10-03: 30 mg via INTRAVENOUS

## 2016-10-03 MED ORDER — MIDAZOLAM HCL 2 MG/2ML IJ SOLN: 1.0000 mg | INTRAMUSCULAR | Status: DC | PRN

## 2016-10-03 MED ORDER — FENTANYL CITRATE (PF) 100 MCG/2ML IJ SOLN: 50.0000 ug | INTRAMUSCULAR | Status: DC | PRN

## 2016-10-03 MED ORDER — FENTANYL CITRATE (PF) 100 MCG/2ML IJ SOLN
INTRAMUSCULAR | Status: DC | PRN
  Administered 2016-10-03: 100 ug via INTRAVENOUS

## 2016-10-03 MED ORDER — TRAMADOL HCL 50 MG PO TABS: ORAL_TABLET | ORAL | 0 refills | Status: DC

## 2016-10-03 MED ORDER — FENTANYL CITRATE (PF) 100 MCG/2ML IJ SOLN: 25.0000 ug | INTRAMUSCULAR | Status: DC | PRN

## 2016-10-03 MED ORDER — PROMETHAZINE HCL 25 MG/ML IJ SOLN
6.2500 mg | INTRAMUSCULAR | Status: DC | PRN
  Administered 2016-10-03: 6.25 mg via INTRAVENOUS

## 2016-10-03 MED ORDER — CEFAZOLIN SODIUM-DEXTROSE 2-3 GM-% IV SOLR
INTRAVENOUS | Status: DC | PRN
  Administered 2016-10-03: 2 g via INTRAVENOUS

## 2016-10-03 SURGICAL SUPPLY — 34 items
BANDAGE COBAN STERILE 2 (GAUZE/BANDAGES/DRESSINGS) ×2 IMPLANT
BLADE SURG 15 STRL LF DISP TIS (BLADE) ×2 IMPLANT
BLADE SURG 15 STRL SS (BLADE) ×4
BNDG CMPR 9X4 STRL LF SNTH (GAUZE/BANDAGES/DRESSINGS)
BNDG CONFORM 2 STRL LF (GAUZE/BANDAGES/DRESSINGS) ×2 IMPLANT
BNDG ESMARK 4X9 LF (GAUZE/BANDAGES/DRESSINGS) IMPLANT
CHLORAPREP W/TINT 26ML (MISCELLANEOUS) ×2 IMPLANT
CORD BIPOLAR FORCEPS 12FT (ELECTRODE) ×2 IMPLANT
COVER BACK TABLE 60X90IN (DRAPES) ×2 IMPLANT
COVER MAYO STAND STRL (DRAPES) ×2 IMPLANT
CUFF TOURNIQUET SINGLE 18IN (TOURNIQUET CUFF) ×2 IMPLANT
DRAPE EXTREMITY T 121X128X90 (DRAPE) ×2 IMPLANT
DRAPE SURG 17X23 STRL (DRAPES) ×2 IMPLANT
GAUZE SPONGE 4X4 12PLY STRL (GAUZE/BANDAGES/DRESSINGS) ×2 IMPLANT
GAUZE XEROFORM 1X8 LF (GAUZE/BANDAGES/DRESSINGS) ×2 IMPLANT
GLOVE BIO SURGEON STRL SZ 6.5 (GLOVE) ×1 IMPLANT
GLOVE BIO SURGEON STRL SZ7.5 (GLOVE) ×2 IMPLANT
GLOVE BIOGEL PI IND STRL 7.0 (GLOVE) IMPLANT
GLOVE BIOGEL PI IND STRL 8 (GLOVE) ×1 IMPLANT
GLOVE BIOGEL PI INDICATOR 7.0 (GLOVE) ×2
GLOVE BIOGEL PI INDICATOR 8 (GLOVE) ×1
GOWN STRL REUS W/ TWL LRG LVL3 (GOWN DISPOSABLE) ×1 IMPLANT
GOWN STRL REUS W/TWL LRG LVL3 (GOWN DISPOSABLE) ×2
GOWN STRL REUS W/TWL XL LVL3 (GOWN DISPOSABLE) ×2 IMPLANT
NDL HYPO 25X1 1.5 SAFETY (NEEDLE) ×1 IMPLANT
NEEDLE HYPO 25X1 1.5 SAFETY (NEEDLE) ×2 IMPLANT
NS IRRIG 1000ML POUR BTL (IV SOLUTION) ×2 IMPLANT
PACK BASIN DAY SURGERY FS (CUSTOM PROCEDURE TRAY) ×2 IMPLANT
STOCKINETTE 4X48 STRL (DRAPES) ×2 IMPLANT
SUT ETHILON 4 0 PS 2 18 (SUTURE) ×2 IMPLANT
SYR BULB 3OZ (MISCELLANEOUS) ×2 IMPLANT
SYR CONTROL 10ML LL (SYRINGE) ×2 IMPLANT
TOWEL OR 17X24 6PK STRL BLUE (TOWEL DISPOSABLE) ×4 IMPLANT
UNDERPAD 30X30 (UNDERPADS AND DIAPERS) ×2 IMPLANT

## 2016-10-03 NOTE — Anesthesia Procedure Notes (Signed)
Procedure Name: LMA Insertion Date/Time: 10/03/2016 8:37 AM Performed by: Toula Moos L Pre-anesthesia Checklist: Patient identified, Emergency Drugs available, Suction available, Patient being monitored and Timeout performed Patient Re-evaluated:Patient Re-evaluated prior to induction Oxygen Delivery Method: Circle system utilized Preoxygenation: Pre-oxygenation with 100% oxygen Induction Type: IV induction Ventilation: Mask ventilation without difficulty LMA: LMA inserted LMA Size: 4.0 Number of attempts: 1 Airway Equipment and Method: Bite block Placement Confirmation: positive ETCO2 Tube secured with: Tape Dental Injury: Teeth and Oropharynx as per pre-operative assessment

## 2016-10-03 NOTE — Progress Notes (Signed)
Patient rates pain at a 4 refusing narcotics due to sensitivity. Will give Ultram in phase 2 per Dr. Lissa Hoard

## 2016-10-03 NOTE — H&P (Signed)
[name] is an 51 y.o. female.   Chief Complaint: right trigger thumb HPI: 51 yo female with triggering right thumb.  This is bothersome to her and she wishes to have it surgically released.  Allergies:  Allergies  Allergen Reactions  . Azo [Phenazopyridine] Hives and Swelling    FACIAL SWELLING, CHEST TIGHTNESS  . Morphine And Related Nausea And Vomiting and Other (See Comments)    DIZZINESS  . Opium Itching and Nausea And Vomiting    OPIODS/NARCOTICS - ITCHING OF NOSE, DIZZINESS  . Sulfa Antibiotics Rash    Past Medical History:  Diagnosis Date  . Claustrophobia   . Dental crown present    lower right  . Hyperlipidemia   . Hypertension    states under control with meds., has been on med. x 26 yrs.; states blood pressure goes up when she gets nervous  . Insulin dependent diabetes mellitus ([place])   . Migraines   . Obesity   . Palpitations   . Trigger thumb of right hand 09/2016    Past Surgical History:  Procedure Laterality Date  . ABDOMINAL HYSTERECTOMY     partial  . CARDIAC CATHETERIZATION  05/27/2007   normal coronary arteries  . CARPAL TUNNEL RELEASE Bilateral   . CHOLECYSTECTOMY  1991  . DE QUERVAIN'S RELEASE Right   . GANGLION CYST EXCISION Left    wrist  . INGUINAL HERNIA REPAIR Right   . SOFT TISSUE MASS EXCISION Right 09/04/2009   gluteus  . UTERINE SUSPENSION      Family History: Family History  Problem Relation Age of Onset  . Cancer Other   . Hypertension Other   . Hyperlipidemia Other   . Heart attack Other   . Cancer Mother        Colorectal  . Heart disease Father   . Heart attack Father   . Cancer Sister        Colorectal  . Cancer Brother        Colorectal  . Heart disease Brother   . Heart attack Brother   . Heart disease Paternal Grandmother   . Heart disease Paternal Grandfather     Social History:   reports that she quit smoking about 19 years ago. She has never used smokeless tobacco. She reports that she drinks  alcohol. She reports that she does not use drugs.  Medications: Medications Prior to Admission  Medication Sig Dispense Refill  . amLODipine (NORVASC) 5 MG tablet Take 5 mg by mouth daily.    . insulin NPH-regular Human (NOVOLIN 70/30) (70-30) 100 UNIT/ML injection Inject 100 Units into the skin daily with breakfast.    . metoprolol (LOPRESSOR) 50 MG tablet daily.    . simvastatin (ZOCOR) 20 MG tablet Take 20 mg by mouth daily.    . SUMAtriptan (IMITREX) 50 MG tablet Take 1 tablet (50 mg total) by mouth every 2 (two) hours as needed for migraine. May repeat in 2 hours if headache persists or recurs. 12 tablet 11  . valsartan-hydrochlorothiazide (DIOVAN-HCT) 320-12.5 MG tablet Take 1 tablet by mouth daily.    . meclizine (ANTIVERT) 25 MG tablet Take 25 mg by mouth 3 (three) times daily as needed for dizziness.      Results for orders placed or performed during the hospital encounter of 10/03/16 (from the past 48 hour(s))  Glucose, capillary     Status: Abnormal   Collection Time: 10/03/16  8:13 AM  Result Value Ref Range   Glucose-Capillary 123 (H) 65 -  99 mg/dL    No results found.   A comprehensive review of systems was negative.  Blood pressure (!) 149/79, pulse 92, temperature 98.5 F (36.9 C), temperature source Oral, height 5' 7.5" (1.715 m), weight 113.9 kg (251 lb), SpO2 99 %.  General appearance: alert, cooperative and appears stated age Head: Normocephalic, without obvious abnormality, atraumatic Neck: supple, symmetrical, trachea midline Resp: clear to auscultation bilaterally Cardio: regular rate and rhythm GI: non-tender Extremities: Intact sensation and capillary refill all digits.  +epl/fpl/io.  No wounds.  Pulses: 2+ and symmetric Skin: Skin color, texture, turgor normal. No rashes or lesions Neurologic: Grossly normal Incision/Wound:none  Assessment/Plan Right trigger thumb.  Non operative and operative treatment options were discussed with the patient and  patient wishes to proceed with operative treatment. Risks, benefits, and alternatives of surgery were discussed and the patient agrees with the plan of care.   Daliah Chaudoin R 10/03/2016, 8:28 AM

## 2016-10-03 NOTE — Discharge Instructions (Addendum)

## 2016-10-03 NOTE — Brief Op Note (Signed)
10/03/2016  8:58 AM  PATIENT:  [name]  51 y.o. female  PRE-OPERATIVE DIAGNOSIS:  right thumb trigger  M65.311  POST-OPERATIVE DIAGNOSIS:  right thumb trigger  M65.311  PROCEDURE:  Procedure(s): RELEASE TRIGGER FINGER/A-1 PULLEY RIGHT THUMB (Right)  SURGEON:  Surgeon(s) and Role:    Leanora Cover, MD - Primary  PHYSICIAN ASSISTANT:   ASSISTANTS: none   ANESTHESIA:   general  EBL:  No intake/output data recorded.  BLOOD ADMINISTERED:none  DRAINS: none   LOCAL MEDICATIONS USED:  MARCAINE     SPECIMEN:  No Specimen  DISPOSITION OF SPECIMEN:  N/A  COUNTS:  YES  TOURNIQUET:   Total Tourniquet Time Documented: Upper Arm (Right) - 9 minutes Total: Upper Arm (Right) - 9 minutes   DICTATION: .Note written in EPIC  PLAN OF CARE: Discharge to home after PACU  PATIENT DISPOSITION:  PACU - hemodynamically stable.

## 2016-10-03 NOTE — Transfer of Care (Signed)
Immediate Anesthesia Transfer of Care Note  Patient: [name]  Procedure(s) Performed: Procedure(s): RELEASE TRIGGER FINGER/A-1 PULLEY RIGHT THUMB (Right)  Patient Location: PACU  Anesthesia Type:General  Level of Consciousness: awake and patient cooperative  Airway & Oxygen Therapy: Patient Spontanous Breathing and Patient connected to face mask oxygen  Post-op Assessment: Report given to RN and Post -op Vital signs reviewed and stable  Post vital signs: Reviewed and stable  Last Vitals:  Vitals:   10/03/16 0803  BP: (!) 149/79  Pulse: 92  Temp: 36.9 C  SpO2: 99%    Last Pain:  Vitals:   10/03/16 0803  TempSrc: Oral  PainSc: 9          Complications: No apparent anesthesia complications

## 2016-10-03 NOTE — Op Note (Signed)
10/03/2016 [facility]  Operative Note  PREOPERATIVE DIAGNOSIS: right thumb trigger  M65.311  POSTOPERATIVE DIAGNOSIS:  right thumb trigger  M65.311  PROCEDURE: Procedure(s): RELEASE TRIGGER FINGER/A-1 PULLEY RIGHT THUMB  SURGEON:  Leanora Cover, MD  ASSISTANT:  none.  ANESTHESIA:  General.  IV FLUIDS:  Per anesthesia flow sheet.  ESTIMATED BLOOD LOSS:  Minimal.  COMPLICATIONS:  None.  SPECIMENS:  None.  TOURNIQUET TIME:  Total Tourniquet Time Documented: Upper Arm (Right) - 9 minutes Total: Upper Arm (Right) - 9 minutes   DISPOSITION:  Stable to PACU.  LOCATION: [facility]  INDICATIONS: [name] is a 51 y.o. female with triggering of right thumb.  It is bothersome to her and she wishes to have it surgically released.  Risks, benefits and alternatives of surgery were discussed including the risk of blood loss, infection, damage to nerves, vessels, tendons, ligaments, bone, failure of surgery, need for additional surgery, complications with wound healing, continued pain, continued triggering and need for repeat surgery.  She voiced understanding of these risks and elected to proceed.  OPERATIVE COURSE:  After being identified preoperatively by myself, the patient and I agreed upon the procedure and site of procedure.  The surgical site was marked. The risks, benefits, and alternatives of surgery were reviewed and she wished to proceed.  Surgical consent had been signed. She was given IV Ancef as preoperative antibiotic prophylaxis. She was transported to the operating room and placed on the operating room table in supine position with the Right upper extremity on an arm board. General was induced by the anesthesiologist.  The Right upper extremity was prepped and draped in normal sterile orthopedic fashion. A surgical pause was performed between surgeons, anesthesia, and operating room staff, and all were in agreement as to the patient, procedure,  and site of procedure.  Tourniquet at the proximal aspect of the upper arm proximal aspect of the extremity was inflated after exsanguination of the limb with an Esmarch bandage.   An incision was made at the volar aspect of the MP joint of the thumb.  This was carried into the subcutaneous tissues by preading technique.  Bipolar electrocautery was used to obtain hemostasis.  The radial and ulnar digital nerves were protected throughout the case. The flexor sheath was identified.  The A1 pulley was identified and sharply incised.  It was released in its entirety.  The proximal 1-2 mm of the A2 pulley was vented to allow better excursion of the tendons.  The finger was placed through a range of motion and there was noted to be no catching.  The tendons were brought through the wound and any adherences released.  The wound was then copiously irrigated with sterile saline. It was closed with 4-0 nylon in a horizontal mattress fashion.  It was injected with 0.25% plain Marcaine to aid in postoperative analgesia.  It was dressed with sterile Xeroform, 4x4s, and wrapped lightly with a Coban dressing.  Tourniquet was deflated at 9 minutes.  The fingertips were pink with brisk capillary refill after deflation of the tourniquet.  The operative drapes were broken down and the patient was awoken from anesthesia safely.  She was transferred back to the stretcher and taken to the PACU in stable condition.   I will see her back in the office in 1 week for postoperative followup.  I will give her a prescription for ultram 50 mg 1-2 tabs po q6 hours prn pain, dispense #20.  Tennis Must, MD Electronically signed, 10/03/16

## 2016-10-03 NOTE — Anesthesia Postprocedure Evaluation (Signed)
Anesthesia Post Note  Patient: [name]  Procedure(s) Performed: Procedure(s) (LRB): RELEASE TRIGGER FINGER/A-1 PULLEY RIGHT THUMB (Right)     Patient location during evaluation: PACU Anesthesia Type: Bier Block Level of consciousness: awake and alert Pain management: pain level controlled Vital Signs Assessment: post-procedure vital signs reviewed and stable Respiratory status: spontaneous breathing Cardiovascular status: stable Anesthetic complications: no    Last Vitals:  Vitals:   10/03/16 1015 10/03/16 1030  BP:    Pulse: 71 69  Resp:    Temp:    SpO2: 96% 94%    Last Pain:  Vitals:   10/03/16 1007  TempSrc:   PainSc: [place]

## 2016-10-03 NOTE — Anesthesia Preprocedure Evaluation (Signed)
Anesthesia Evaluation  Patient identified by MRN, date of birth, ID band Patient awake    Reviewed: Allergy & Precautions, NPO status , Patient's Chart, lab work & pertinent test results, reviewed documented beta blocker date and time   Airway Mallampati: II  TM Distance: >3 FB Neck ROM: Full    Dental no notable dental hx.    Pulmonary neg pulmonary ROS, former smoker,    Pulmonary exam normal breath sounds clear to auscultation       Cardiovascular hypertension, Pt. on medications and Pt. on home beta blockers Normal cardiovascular exam Rhythm:Regular Rate:Normal     Neuro/Psych  Headaches, negative psych ROS   GI/Hepatic negative GI ROS, Neg liver ROS,   Endo/Other  diabetes, Poorly Controlled, Type 2  Renal/GU negative Renal ROS     Musculoskeletal negative musculoskeletal ROS (+)   Abdominal   Peds  Hematology negative hematology ROS (+)   Anesthesia Other Findings   Reproductive/Obstetrics negative OB ROS                             Anesthesia Physical Anesthesia Plan  ASA: III  Anesthesia Plan: Bier Block   Post-op Pain Management:    Induction: Intravenous  PONV Risk Score and Plan: 2 and Ondansetron, Midazolam and Propofol infusion  Airway Management Planned:   Additional Equipment:   Intra-op Plan:   Post-operative Plan:   Informed Consent: I have reviewed the patients History and Physical, chart, labs and discussed the procedure including the risks, benefits and alternatives for the proposed anesthesia with the patient or authorized representative who has indicated his/her understanding and acceptance.   Dental advisory given  Plan Discussed with: CRNA  Anesthesia Plan Comments:         Anesthesia Quick Evaluation

## 2016-10-04 ENCOUNTER — Encounter (HOSPITAL_BASED_OUTPATIENT_CLINIC_OR_DEPARTMENT_OTHER): Payer: Self-pay | Admitting: Orthopedic Surgery

## 2016-10-14 DIAGNOSIS — E785 Hyperlipidemia, unspecified: Secondary | ICD-10-CM | POA: Diagnosis not present

## 2016-10-14 DIAGNOSIS — E1165 Type 2 diabetes mellitus with hyperglycemia: Secondary | ICD-10-CM | POA: Diagnosis not present

## 2016-10-14 DIAGNOSIS — I10 Essential (primary) hypertension: Secondary | ICD-10-CM | POA: Diagnosis not present

## 2016-10-14 DIAGNOSIS — R609 Edema, unspecified: Secondary | ICD-10-CM | POA: Diagnosis not present

## 2016-12-02 DIAGNOSIS — R079 Chest pain, unspecified: Secondary | ICD-10-CM | POA: Diagnosis not present

## 2016-12-02 DIAGNOSIS — R51 Headache: Secondary | ICD-10-CM | POA: Diagnosis not present

## 2016-12-02 DIAGNOSIS — R1013 Epigastric pain: Secondary | ICD-10-CM | POA: Diagnosis not present

## 2016-12-18 DIAGNOSIS — M5412 Radiculopathy, cervical region: Secondary | ICD-10-CM | POA: Diagnosis not present

## 2016-12-18 DIAGNOSIS — M19011 Primary osteoarthritis, right shoulder: Secondary | ICD-10-CM | POA: Diagnosis not present

## 2016-12-18 DIAGNOSIS — M7541 Impingement syndrome of right shoulder: Secondary | ICD-10-CM | POA: Diagnosis not present

## 2016-12-18 DIAGNOSIS — E119 Type 2 diabetes mellitus without complications: Secondary | ICD-10-CM | POA: Diagnosis not present

## 2017-01-01 DIAGNOSIS — I10 Essential (primary) hypertension: Secondary | ICD-10-CM | POA: Diagnosis not present

## 2017-01-01 DIAGNOSIS — E1165 Type 2 diabetes mellitus with hyperglycemia: Secondary | ICD-10-CM | POA: Diagnosis not present

## 2017-01-01 DIAGNOSIS — Z0000 Encounter for general adult medical examination without abnormal findings: Secondary | ICD-10-CM | POA: Diagnosis not present

## 2017-01-01 DIAGNOSIS — E785 Hyperlipidemia, unspecified: Secondary | ICD-10-CM | POA: Diagnosis not present

## 2017-01-13 DIAGNOSIS — J069 Acute upper respiratory infection, unspecified: Secondary | ICD-10-CM | POA: Diagnosis not present

## 2017-04-09 DIAGNOSIS — I10 Essential (primary) hypertension: Secondary | ICD-10-CM | POA: Diagnosis not present

## 2017-04-09 DIAGNOSIS — E1165 Type 2 diabetes mellitus with hyperglycemia: Secondary | ICD-10-CM | POA: Diagnosis not present

## 2017-06-18 ENCOUNTER — Encounter: Payer: Self-pay | Admitting: Internal Medicine

## 2017-06-18 ENCOUNTER — Ambulatory Visit (INDEPENDENT_AMBULATORY_CARE_PROVIDER_SITE_OTHER): Payer: 59 | Admitting: Internal Medicine

## 2017-06-18 VITALS — BP 126/72 | HR 73 | Ht 67.5 in | Wt 265.0 lb

## 2017-06-18 DIAGNOSIS — Z794 Long term (current) use of insulin: Secondary | ICD-10-CM | POA: Diagnosis not present

## 2017-06-18 DIAGNOSIS — E11319 Type 2 diabetes mellitus with unspecified diabetic retinopathy without macular edema: Secondary | ICD-10-CM | POA: Insufficient documentation

## 2017-06-18 DIAGNOSIS — E785 Hyperlipidemia, unspecified: Secondary | ICD-10-CM | POA: Insufficient documentation

## 2017-06-18 MED ORDER — EMPAGLIFLOZIN 25 MG PO TABS: 25.0000 mg | ORAL_TABLET | Freq: Every day | ORAL | 3 refills | Status: DC

## 2017-06-18 MED ORDER — INSULIN REGULAR HUMAN (CONC) 500 UNIT/ML ~~LOC~~ SOPN: PEN_INJECTOR | SUBCUTANEOUS | 5 refills | Status: DC

## 2017-06-18 MED ORDER — METFORMIN HCL 1000 MG PO TABS
1000.0000 mg | ORAL_TABLET | Freq: Two times a day (BID) | ORAL | 3 refills | Status: DC
Start: 2017-06-18 — End: 2017-12-14

## 2017-06-18 NOTE — Patient Instructions (Addendum)
Please start Metformin 500 mg with dinner x 3 days, then increase to 2x a day. If you tolerate this well, increase to 1000 mg 2x a day.  Start Jardiance 25 mg daily before b'fast.  Start U500 insulin: - 150 units before b'fast - 75 units before unch - 100 units before dinner (40 units before a snack)  Please stop the 70/30 and the R insulin.  Please let me know if the sugars are consistently <80 or >200.  Please return in 1.5 months with your sugar log.   PATIENT INSTRUCTIONS FOR TYPE 2 DIABETES:  **Please join MyChart!** - see attached instructions about how to join if you have not done so already.  DIET AND EXERCISE Diet and exercise is an important part of diabetic treatment.  We recommended aerobic exercise in the form of brisk walking (working between 40-60% of maximal aerobic capacity, similar to brisk walking) for 150 minutes per week (such as 30 minutes five days per week) along with 3 times per week performing 'resistance' training (using various gauge rubber tubes with handles) 5-10 exercises involving the major muscle groups (upper body, lower body and core) performing 10-15 repetitions (or near fatigue) each exercise. Start at half the above goal but build slowly to reach the above goals. If limited by weight, joint pain, or disability, we recommend daily walking in a swimming pool with water up to waist to reduce pressure from joints while allow for adequate exercise.    BLOOD GLUCOSES Monitoring your blood glucoses is important for continued management of your diabetes. Please check your blood glucoses 2-4 times a day: fasting, before meals and at bedtime (you can rotate these measurements - e.g. one day check before the 3 meals, the next day check before 2 of the meals and before bedtime, etc.).   HYPOGLYCEMIA (low blood sugar) Hypoglycemia is usually a reaction to not eating, exercising, or taking too much insulin/ other diabetes drugs.  Symptoms include tremors,  sweating, hunger, confusion, headache, etc. Treat IMMEDIATELY with 15 grams of Carbs: . 4 glucose tablets .  cup regular juice/soda . 2 tablespoons raisins . 4 teaspoons sugar . 1 tablespoon honey Recheck blood glucose in 15 mins and repeat above if still symptomatic/blood glucose <100.  RECOMMENDATIONS TO REDUCE YOUR RISK OF DIABETIC COMPLICATIONS: * Take your prescribed MEDICATION(S) * Follow a DIABETIC diet: Complex carbs, fiber rich foods, (monounsaturated and polyunsaturated) fats * AVOID saturated/trans fats, high fat foods, >2,300 mg salt per day. * EXERCISE at least 5 times a week for 30 minutes or preferably daily.  * DO NOT SMOKE OR DRINK more than 1 drink a day. * Check your FEET every day. Do not wear tightfitting shoes. Contact us if you develop an ulcer * See your EYE doctor once a year or more if needed * Get a FLU shot once a year * Get a PNEUMONIA vaccine once before and once after age 54 years  GOALS:  * Your Hemoglobin A1c of <7%  * fasting sugars need to be <130 * after meals sugars need to be <180 (2h after you start eating) * Your Systolic BP should be 580 or lower  * Your Diastolic BP should be 80 or lower  * Your HDL (Good Cholesterol) should be 40 or higher  * Your LDL (Bad Cholesterol) should be 100 or lower. * Your Triglycerides should be 150 or lower  * Your Urine microalbumin (kidney function) should be <30 * Your Body Mass Index should be 25 or  lower    Please consider the following ways to cut down carbs and fat and increase fiber and micronutrients in your diet: - substitute whole grain for white bread or pasta - substitute brown rice for white rice - substitute 90-calorie flat bread pieces for slices of bread when possible - substitute sweet potatoes or yams for white potatoes - substitute humus for margarine - substitute tofu for cheese when possible - substitute almond or rice milk for regular milk (would not drink soy milk daily due to  concern for soy estrogen influence on breast cancer risk) - substitute dark chocolate for other sweets when possible - substitute water - can add lemon or orange slices for taste - for diet sodas (artificial sweeteners will trick your body that you can eat sweets without getting calories and will lead you to overeating and weight gain in the long run) - do not skip breakfast or other meals (this will slow down the metabolism and will result in more weight gain over time)  - can try smoothies made from fruit and almond/rice milk in am instead of regular breakfast - can also try old-fashioned (not instant) oatmeal made with almond/rice milk in am - order the dressing on the side when eating salad at a restaurant (pour less than half of the dressing on the salad) - eat as little meat as possible - can try juicing, but should not forget that juicing will get rid of the fiber, so would alternate with eating raw veg./fruits or drinking smoothies - use as little oil as possible, even when using olive oil - can dress a salad with a mix of balsamic vinegar and lemon juice, for e.g. - use agave nectar, stevia sugar, or regular sugar rather than artificial sweateners - steam or broil/roast veggies  - snack on veggies/fruit/nuts (unsalted, preferably) when possible, rather than processed foods - reduce or eliminate aspartame in diet (it is in diet sodas, chewing gum, etc) Read the labels!  Try to read Dr. Janene Harvey book: "Program for Reversing Diabetes" for other ideas for healthy eating.

## 2017-06-18 NOTE — Progress Notes (Signed)
Patient ID: [name], female   DOB: 29-Aug-1965, 52 y.o.   MRN: [id number]   HPI: [name] is a 52 y.o.-year-old female, referred by her PCP, Dr. Baird Cancer, for management of DM2, dx in 2006, insulin-dependent in 2015, uncontrolled, with complications (+ DR OS).  She is here with her husband who also has diabetes and offers part of the history, especially related to her diet and past medical history.  Last hemoglobin A1c was: 04/21/2017: HbA1c 7.5% 01/01/2017: HbA1c 8.1% 07/17/2016: HbA1c 7.7% 04/01/2016: HbA1c 7.4%, glucose 162, C-peptide 6.3% Lab Results  Component Value Date   HGBA1C (H) 01/20/2010    6.7 (NOTE)                                                                       According to the ADA Clinical Practice Recommendations for 2011, when HbA1c is used as a screening test:   >=6.5%   Diagnostic of Diabetes Mellitus           (if abnormal result  is confirmed)  5.7-6.4%   Increased risk of developing Diabetes Mellitus  References:Diagnosis and Classification of Diabetes Mellitus,Diabetes TIWP,8099,83(JASNK 1):S62-S69 and Standards of Medical Care in         Diabetes - 2011,Diabetes Care,2011,34  (Suppl 1):S11-S61.   HGBA1C (H) 05/27/2007    8.6 (NOTE)   The ADA recommends the following therapeutic goals for glycemic   control related to Hgb A1C measurement:   Goal of Therapy:   < 7.0% Hgb A1C   Action Suggested:  > 8.0% Hgb A1C   Ref:  Diabetes Care, 22, Suppl. 1, 1999   Pt is on a regimen of: - Novolin insulin 70/30 100 units 2x a day 30 min before meals  - R insulin 60-100 units per meal + 80-100 units at bedtime She tried Byetta >> allergy at inj site. She tried Ozempic in 10/2016 >> allergy at inj site. She tried Victoza >> allergy at inj site. She tried Metformin >> stopped but unclear why She tries [place], [place].  Pt checks her sugars 1x a day and they are: - am: 131-244 - 2h after b'fast: n/c - before lunch: n/c - 2h after lunch: n/c - before dinner:  n/c - 2h after dinner: n/c - bedtime: n/c - nighttime: n/c No lows. Lowest sugar was 97; she has hypoglycemia awareness at 130 >> HA.  Highest sugar was 247.  Glucometer: reliON  Pt's meals are mostly out - Breakfast: 2 eggs, bacon, apples, toast - Lunch: salad, sandwich  - Dinner: varies -eats out - Snacks: walnut 3x a day  - no CKD, last BUN/creatinine:  04/09/2017: 9/0.54, EGFR 109, glucose 178 Lab Results  Component Value Date   BUN 8 10/01/2016   BUN 5 (L) 01/20/2010   CREATININE 0.61 10/01/2016   CREATININE 0.52 01/20/2010  On valsartan 320.  -+ HL; last set of lipids:  04/12/2017: 123/154/27/65 Lab Results  Component Value Date   CHOL  05/27/2007    186        ATP III CLASSIFICATION:  <200     mg/dL   Desirable  200-239  mg/dL   Borderline High  >=240    mg/dL   High   HDL 23 (  L) 05/27/2007   LDLCALC  05/27/2007    90        Total Cholesterol/HDL:CHD Risk Coronary Heart Disease Risk Table                     Men   Women  1/2 Average Risk   3.4   3.3   TRIG 363 (H) 05/27/2007   CHOLHDL 8.1 05/27/2007  On simvastatin 20.  - last eye exam was in Spring 2018: +  DR OS  - no numbness and tingling in her feet.  Pt has FH of DM in daughter, PGF.  She also has a history of HTN.  ROS: Constitutional: + Weight gain, + fatigue, + heat intolerance and hot flashes, + excessive urination Eyes: + Blurry vision, no xerophthalmia ENT: no sore throat, no nodules palpated in throat, no dysphagia/odynophagia, no hoarseness, + tinnitus Cardiovascular: no CP/SOB/+ palpitations/+ leg swelling Respiratory: no cough/SOB Gastrointestinal: no N/V/D/C Musculoskeletal: + Both: Muscle/joint aches Skin: no rashes, + hair loss Neurological: no tremors/numbness/tingling/dizziness, + headache Psychiatric: no depression/anxiety + Low libido  Past Medical History:  Diagnosis Date  . Claustrophobia   . Dental crown present    lower right  . Hyperlipidemia   . Hypertension     states under control with meds., has been on med. x 26 yrs.; states blood pressure goes up when she gets nervous  . Insulin dependent diabetes mellitus ([place])   . Migraines   . Obesity   . Palpitations   . Trigger thumb of right hand 09/2016   Past Surgical History:  Procedure Laterality Date  . ABDOMINAL HYSTERECTOMY     partial  . CARDIAC CATHETERIZATION  05/27/2007   normal coronary arteries  . CARPAL TUNNEL RELEASE Bilateral   . CHOLECYSTECTOMY  1991  . DE QUERVAIN'S RELEASE Right   . GANGLION CYST EXCISION Left    wrist  . INGUINAL HERNIA REPAIR Right   . SOFT TISSUE MASS EXCISION Right 09/04/2009   gluteus  . TRIGGER FINGER RELEASE Right 10/03/2016   Procedure: RELEASE TRIGGER FINGER/A-1 PULLEY RIGHT THUMB;  Surgeon: Leanora Cover, MD;  Location: [facility];  Service: Orthopedics;  Laterality: Right;  . UTERINE SUSPENSION     Social History   Socioeconomic History  . Marital status: Married    Spouse name: Not on file  . Number of children: 2  . Years of education: Associates  . Highest education level: Not on file  Occupational History  . Occupation: Nurse  Social Needs  . Financial resource strain: Not on file  . Food insecurity:    Worry: Not on file    Inability: Not on file  . Transportation needs:    Medical: Not on file    Non-medical: Not on file  Tobacco Use  . Smoking status: Former Smoker    Last attempt to quit: 02/03/1997    Years since quitting: 20.3  . Smokeless tobacco: Never Used  Substance and Sexual Activity  . Alcohol use: Yes    Comment: occasionally  . Drug use: No  . Sexual activity: Not on file  Lifestyle  . Physical activity:    Days per week: Not on file    Minutes per session: Not on file  . Stress: Not on file  Relationships  . Social connections:    Talks on phone: Not on file    Gets together: Not on file    Attends religious service: Not on file    Active  member of club or organization: Not on file     Attends meetings of clubs or organizations: Not on file    Relationship status: Not on file  . Intimate partner violence:    Fear of current or ex partner: Not on file    Emotionally abused: Not on file    Physically abused: Not on file    Forced sexual activity: Not on file  Other Topics Concern  . Not on file  Social History Narrative   Lives at home with husband.   Right-handed.   Occasional use of caffeine.   Current Outpatient Medications on File Prior to Visit  Medication Sig Dispense Refill  . amLODipine (NORVASC) 5 MG tablet Take 5 mg by mouth daily.    . insulin NPH-regular Human (NOVOLIN 70/30) (70-30) 100 UNIT/ML injection Inject 100 Units into the skin daily with breakfast.    . meclizine (ANTIVERT) 25 MG tablet Take 25 mg by mouth 3 (three) times daily as needed for dizziness.    . metoprolol (LOPRESSOR) 50 MG tablet daily.    . simvastatin (ZOCOR) 20 MG tablet Take 20 mg by mouth daily.    . SUMAtriptan (IMITREX) 50 MG tablet Take 1 tablet (50 mg total) by mouth every 2 (two) hours as needed for migraine. May repeat in 2 hours if headache persists or recurs. 12 tablet 11  . traMADol (ULTRAM) 50 MG tablet 1-2 tabs PO q6 hours prn pain 20 tablet 0  . valsartan-hydrochlorothiazide (DIOVAN-HCT) 320-12.5 MG tablet Take 1 tablet by mouth daily.     No current facility-administered medications on file prior to visit.    Allergies  Allergen Reactions  . Azo [Phenazopyridine] Hives and Swelling    FACIAL SWELLING, CHEST TIGHTNESS  . Morphine And Related Nausea And Vomiting and Other (See Comments)    DIZZINESS  . Opium Itching and Nausea And Vomiting    OPIODS/NARCOTICS - ITCHING OF NOSE, DIZZINESS  . Sulfa Antibiotics Rash   Family History  Problem Relation Age of Onset  . Cancer Other   . Hypertension Other   . Hyperlipidemia Other   . Heart attack Other   . Cancer Mother        Colorectal  . Heart disease Father   . Heart attack Father   . Cancer Sister         Colorectal  . Cancer Brother        Colorectal  . Heart disease Brother   . Heart attack Brother   . Heart disease Paternal Grandmother   . Heart disease Paternal Grandfather     PE: BP 126/72   Pulse 73   Ht 5' 7.5" (1.715 m)   Wt 265 lb (120.2 kg)   SpO2 97%   BMI 40.89 kg/m  Wt Readings from Last 3 Encounters:  06/18/17 265 lb (120.2 kg)  10/03/16 251 lb (113.9 kg)  08/08/16 245 lb (111.1 kg)   Constitutional: obese, in NAD Eyes: PERRLA, EOMI, no exophthalmos ENT: moist mucous membranes, no thyromegaly, no cervical lymphadenopathy Cardiovascular: RRR, No MRG Respiratory: CTA B Gastrointestinal: abdomen soft, NT, ND, BS+ Musculoskeletal: no deformities, strength intact in all 4 Skin: moist, warm, no rashes Neurological: no tremor with outstretched hands, DTR normal in all 4  ASSESSMENT: 1. DM2, insulin-dependent, uncontrolled, with complications - DR OS  PLAN:  1. Patient with long-standing, uncontrolled diabetes, on very high doses of premixed and regular insulin, which became insufficient.  Reviewing the insulin dose that she is taken at  home, she takes more than prescribed, a total of approximately 470 units insulin a day.  She tells me that she would like to come off insulin.  I advised her that this is a very high dose to come off from, but we discussed about different options to be able to decrease the doses.   - She has tried metformin in the past and does not remember why she was taken off.  She agrees to try it again. - She tried 3 GLP-1 receptor agonist in the past and developed injection site reactions to al. She is reticent to try Trulicity. - We also discussed about SGLT2 inhibitors, and she agrees to try Jardiance.  We discussed about benefits of these medicines and her mechanism of action - Regarding her insulin doses, since she is on such a high dose of insulin, I recommended U500 insulin.  She brings formulary list by her insurance and U500 insulin pens are  tier 1.  Reviewing her meals, I made suggestions about the doses but I advised her that these are only starting doses, most likely will need to increase them in the next few weeks. - We also discussed about the concept of insulin resistance and how she can improve and reverse this.  I suggested to limit fat in her diet and given specific increase samples of meals.  She and her husband are interested i in improving her diet, but they both admit that this is very difficult for them, since they are eating almost all of their meals out.  Discussed that this is a hurdle towards diabetes control, even eating out twice a week  can worsen the control.  I also advised her to stop drinking diet sodas, which she is now using twice a day.  Given examples of healthier drinks.  If she is not able to follow the suggested diet, I advised her to let me know and I can refer her to the [facility] weight loss clinic. - I suggested to:  Patient Instructions  Please start Metformin 500 mg with dinner x 3 days, then increase to 2x a day. If you tolerate this well, increase to 1000 mg 2x a day.  Start Jardiance 25 mg daily before b'fast.  Start U500 insulin: - 150 units before b'fast - 75 units before unch - 100 units before dinner (40 units before a snack)  Please stop the 70/30 and the R insulin.  Please let me know if the sugars are consistently <80 or >200.  Please return in 1.5 months with your sugar log.   - Strongly advised her to start checking sugars at different times of the day - check 2-3 times a day, rotating checks - given sugar log and advised how to fill it and to bring it at next appt  - given foot care handout and explained the principles  - given instructions for hypoglycemia management "15-15 rule"  - advised for yearly eye exams  - Return to clinic in 1.5 mo with sugar log   Philemon Kingdom, MD PhD [facility] Endocrinology

## 2017-07-21 ENCOUNTER — Other Ambulatory Visit: Payer: Self-pay

## 2017-07-21 ENCOUNTER — Telehealth: Payer: Self-pay | Admitting: Internal Medicine

## 2017-07-21 MED ORDER — INSULIN REGULAR HUMAN (CONC) 500 UNIT/ML ~~LOC~~ SOPN: PEN_INJECTOR | SUBCUTANEOUS | 0 refills | Status: DC

## 2017-07-21 NOTE — Telephone Encounter (Signed)
Need a 90 day supply of insulin regular human CONCENTRATED (HUMULIN R U-500 KWIKPEN) 500 UNIT/ML kwikpen [[id number]] send to  [place], [place], [place] [address]   Phone: [phone]

## 2017-07-21 NOTE — Telephone Encounter (Signed)
Sent this morning.

## 2017-07-22 ENCOUNTER — Other Ambulatory Visit: Payer: Self-pay | Admitting: Internal Medicine

## 2017-07-23 ENCOUNTER — Other Ambulatory Visit: Payer: Self-pay | Admitting: Internal Medicine

## 2017-07-28 DIAGNOSIS — J0100 Acute maxillary sinusitis, unspecified: Secondary | ICD-10-CM | POA: Diagnosis not present

## 2017-08-08 ENCOUNTER — Ambulatory Visit (INDEPENDENT_AMBULATORY_CARE_PROVIDER_SITE_OTHER): Payer: 59 | Admitting: Internal Medicine

## 2017-08-08 ENCOUNTER — Other Ambulatory Visit: Payer: Self-pay | Admitting: Internal Medicine

## 2017-08-08 ENCOUNTER — Encounter: Payer: Self-pay | Admitting: Internal Medicine

## 2017-08-08 VITALS — BP 128/70 | HR 77 | Ht 67.5 in | Wt 257.8 lb

## 2017-08-08 DIAGNOSIS — E785 Hyperlipidemia, unspecified: Secondary | ICD-10-CM | POA: Diagnosis not present

## 2017-08-08 DIAGNOSIS — Z794 Long term (current) use of insulin: Secondary | ICD-10-CM | POA: Diagnosis not present

## 2017-08-08 DIAGNOSIS — E11319 Type 2 diabetes mellitus with unspecified diabetic retinopathy without macular edema: Secondary | ICD-10-CM

## 2017-08-08 DIAGNOSIS — E669 Obesity, unspecified: Secondary | ICD-10-CM | POA: Insufficient documentation

## 2017-08-08 LAB — POCT GLYCOSYLATED HEMOGLOBIN (HGB A1C): HEMOGLOBIN A1C: 7.1 % — AB (ref 4.0–5.6)

## 2017-08-08 NOTE — Patient Instructions (Addendum)
Please continue: - Metformin 1000 mg 2x a day. - Jardiance 25 mg daily before b'fast.  Please use: - U500 insulin: - 150 units before b'fast - 75-85 units before lunch - 100 units before dinner  Please use: - 30-40 units before a snack  Please have your kidney tests and potassium checked by PCP.  Please return in 3 months with your sugar log.

## 2017-08-08 NOTE — Progress Notes (Signed)
Patient ID: [name], female   DOB: 09-Jan-1966, 52 y.o.   MRN: [id number]   HPI: [name] is a 52 y.o.-year-old female,  returning for follow-up forDM2, dx in 2006, insulin-dependent in 2015, uncontrolled, with complications (+ DR OS).  She is here with her husband who also has diabetes and offers part of the history, especially related to her diet and medication doses.  Last hemoglobin A1c was: 04/21/2017: HbA1c 7.5% 01/01/2017: HbA1c 8.1% 07/17/2016: HbA1c 7.7% 04/01/2016: HbA1c 7.4%, glucose 162, C-peptide 6.3% Lab Results  Component Value Date   HGBA1C (H) 01/20/2010    6.7 (NOTE)                                                                       According to the ADA Clinical Practice Recommendations for 2011, when HbA1c is used as a screening test:   >=6.5%   Diagnostic of Diabetes Mellitus           (if abnormal result  is confirmed)  5.7-6.4%   Increased risk of developing Diabetes Mellitus  References:Diagnosis and Classification of Diabetes Mellitus,Diabetes PTWS,5681,27(NTZGY 1):S62-S69 and Standards of Medical Care in         Diabetes - 2011,Diabetes Care,2011,34  (Suppl 1):S11-S61.   HGBA1C (H) 05/27/2007    8.6 (NOTE)   The ADA recommends the following therapeutic goals for glycemic   control related to Hgb A1C measurement:   Goal of Therapy:   < 7.0% Hgb A1C   Action Suggested:  > 8.0% Hgb A1C   Ref:  Diabetes Care, 22, Suppl. 1, 1999   At last visit, she was on the following regimen: - Novolin insulin 70/30 100 units 2x a day 30 min before meals  - R insulin 60-100 units per meal + 80-100 units at bedtime She tried Byetta >> allergy at inj site. She tried Ozempic in 10/2016 >> allergy at inj site. She tried Victoza >> allergy at inj site. She tried Metformin >> stopped but unclear why She tries [place], [place].  At that time, I advised her to change to: - Metformin 1000 mg 2x a day. - Jardiance 25 mg daily before b'fast. - U500 insulin: - 150 units before  b'fast - 75 units before unch - 100 units before dinner (40 units before a snack)  Pt checks her sugars 2 times a day: - am: 131-244 >> 176-232, 240 - 2h after b'fast: n/c >> 112-160, 180 - before lunch: n/c >> 98-145 - 2h after lunch: n/c >> 110-152 - before dinner: n/c >> 105-135, 194 - 2h after dinner: n/c >> 165, 170 - bedtime: n/c >> 115-152, 185 - nighttime: n/c Lowest sugar was 97 >> 97; she has hypoglycemia awareness at 130: Headache.  Highest sugar was 247 >> 240.  Glucometer: reliON  Pt's meals are mostly out - Breakfast: 2 eggs, bacon, apples, toast - Lunch: salad, sandwich  - Dinner: varies -eats out - Snacks: bedtime: chips, PB sandwich  - no CKD, last BUN/creatinine:  04/09/2017: 9/0.54, EGFR 109, glucose 178 Lab Results  Component Value Date   BUN 8 10/01/2016   BUN 5 (L) 01/20/2010   CREATININE 0.61 10/01/2016   CREATININE 0.52 01/20/2010  On valsartan 320.  -+ HL; last  set of lipids:  04/12/2017: 123/154/27/65 Lab Results  Component Value Date   CHOL  05/27/2007    186        ATP III CLASSIFICATION:  <200     mg/dL   Desirable  200-239  mg/dL   Borderline High  >=240    mg/dL   High   HDL 23 (L) 05/27/2007   LDLCALC  05/27/2007    90        Total Cholesterol/HDL:CHD Risk Coronary Heart Disease Risk Table                     Men   Women  1/2 Average Risk   3.4   3.3   TRIG 363 (H) 05/27/2007   CHOLHDL 8.1 05/27/2007  On simvastatin 20.  - last eye exam was in spring 2018: + DR OS  - no numbness and tingling in her feet.  Pt has FH of DM in daughter, PGF.  She also has a history of HTN.  ROS: Constitutional: no weight gain/no weight loss, no fatigue, no subjective hyperthermia, no subjective hypothermia Eyes: no blurry vision, no xerophthalmia ENT: no sore throat, no nodules palpated in throat, no dysphagia, no odynophagia, no hoarseness Cardiovascular: no CP/no SOB/no palpitations/no leg swelling Respiratory: no cough/no SOB/no  wheezing Gastrointestinal: no N/no V/no D/no C/no acid reflux Musculoskeletal: no muscle aches/no joint aches Skin: no rashes, no hair loss Neurological: no tremors/no numbness/no tingling/no dizziness  I reviewed pt's medications, allergies, PMH, social hx, family hx, and changes were documented in the history of present illness. Otherwise, unchanged from my initial visit note.  Past Medical History:  Diagnosis Date  . Claustrophobia   . Dental crown present    lower right  . Hyperlipidemia   . Hypertension    states under control with meds., has been on med. x 26 yrs.; states blood pressure goes up when she gets nervous  . Insulin dependent diabetes mellitus ([place])   . Migraines   . Obesity   . Palpitations   . Trigger thumb of right hand 09/2016   Past Surgical History:  Procedure Laterality Date  . ABDOMINAL HYSTERECTOMY     partial  . CARDIAC CATHETERIZATION  05/27/2007   normal coronary arteries  . CARPAL TUNNEL RELEASE Bilateral   . CHOLECYSTECTOMY  1991  . DE QUERVAIN'S RELEASE Right   . GANGLION CYST EXCISION Left    wrist  . INGUINAL HERNIA REPAIR Right   . SOFT TISSUE MASS EXCISION Right 09/04/2009   gluteus  . TRIGGER FINGER RELEASE Right 10/03/2016   Procedure: RELEASE TRIGGER FINGER/A-1 PULLEY RIGHT THUMB;  Surgeon: Leanora Cover, MD;  Location: [facility];  Service: Orthopedics;  Laterality: Right;  . UTERINE SUSPENSION     Social History   Socioeconomic History  . Marital status: Married    Spouse name: Not on file  . Number of children: 2  . Years of education: Associates  . Highest education level: Not on file  Occupational History  . Occupation: Nurse  Social Needs  . Financial resource strain: Not on file  . Food insecurity:    Worry: Not on file    Inability: Not on file  . Transportation needs:    Medical: Not on file    Non-medical: Not on file  Tobacco Use  . Smoking status: Former Smoker    Last attempt to quit:  02/03/1997    Years since quitting: 20.5  . Smokeless tobacco: Never Used  Substance and Sexual Activity  . Alcohol use: Yes    Comment: occasionally  . Drug use: No  . Sexual activity: Not on file  Lifestyle  . Physical activity:    Days per week: Not on file    Minutes per session: Not on file  . Stress: Not on file  Relationships  . Social connections:    Talks on phone: Not on file    Gets together: Not on file    Attends religious service: Not on file    Active member of club or organization: Not on file    Attends meetings of clubs or organizations: Not on file    Relationship status: Not on file  . Intimate partner violence:    Fear of current or ex partner: Not on file    Emotionally abused: Not on file    Physically abused: Not on file    Forced sexual activity: Not on file  Other Topics Concern  . Not on file  Social History Narrative   Lives at home with husband.   Right-handed.   Occasional use of caffeine.   Current Outpatient Medications on File Prior to Visit  Medication Sig Dispense Refill  . amLODipine (NORVASC) 5 MG tablet Take 5 mg by mouth daily.    . empagliflozin (JARDIANCE) 25 MG TABS tablet Take 25 mg by mouth daily. 45 tablet 3  . insulin NPH-regular Human (NOVOLIN 70/30) (70-30) 100 UNIT/ML injection Inject 100 Units into the skin daily with breakfast.    . insulin regular human CONCENTRATED (HUMULIN R U-500 KWIKPEN) 500 UNIT/ML kwikpen Inject up to 350 units a day under skin as advised 18 pen 0  . meclizine (ANTIVERT) 25 MG tablet Take 25 mg by mouth 3 (three) times daily as needed for dizziness.    . metFORMIN (GLUCOPHAGE) 1000 MG tablet Take 1 tablet (1,000 mg total) by mouth 2 (two) times daily with a meal. 90 tablet 3  . metoprolol (LOPRESSOR) 50 MG tablet daily.    . simvastatin (ZOCOR) 20 MG tablet Take 20 mg by mouth daily.    . SUMAtriptan (IMITREX) 50 MG tablet Take 1 tablet (50 mg total) by mouth every 2 (two) hours as needed for  migraine. May repeat in 2 hours if headache persists or recurs. 12 tablet 11  . traMADol (ULTRAM) 50 MG tablet 1-2 tabs PO q6 hours prn pain 20 tablet 0  . valsartan-hydrochlorothiazide (DIOVAN-HCT) 320-12.5 MG tablet Take 1 tablet by mouth daily.     No current facility-administered medications on file prior to visit.    Allergies  Allergen Reactions  . Azo [Phenazopyridine] Hives and Swelling    FACIAL SWELLING, CHEST TIGHTNESS  . Morphine And Related Nausea And Vomiting and Other (See Comments)    DIZZINESS  . Opium Itching and Nausea And Vomiting    OPIODS/NARCOTICS - ITCHING OF NOSE, DIZZINESS  . Sulfa Antibiotics Rash   Family History  Problem Relation Age of Onset  . Cancer Other   . Hypertension Other   . Hyperlipidemia Other   . Heart attack Other   . Cancer Mother        Colorectal  . Heart disease Father   . Heart attack Father   . Cancer Sister        Colorectal  . Cancer Brother        Colorectal  . Heart disease Brother   . Heart attack Brother   . Heart disease Paternal Grandmother   . Heart disease  Paternal Grandfather     PE: BP 128/70   Pulse 77   Ht 5' 7.5" (1.715 m)   Wt 257 lb 12.8 oz (116.9 kg)   SpO2 96%   BMI 39.78 kg/m  Wt Readings from Last 3 Encounters:  08/08/17 257 lb 12.8 oz (116.9 kg)  06/18/17 265 lb (120.2 kg)  10/03/16 251 lb (113.9 kg)   Constitutional: overweight, in NAD Eyes: PERRLA, EOMI, no exophthalmos ENT: moist mucous membranes, no thyromegaly, no cervical lymphadenopathy Cardiovascular: RRR, No MRG Respiratory: CTA B Gastrointestinal: abdomen soft, NT, ND, BS+ Musculoskeletal: no deformities, strength intact in all 4 Skin: moist, warm, no rashes Neurological: no tremor with outstretched hands, DTR normal in all 4  ASSESSMENT: 1. DM2, insulin-dependent, uncontrolled, with complications - DR OS  2. Obesity  PLAN:  1. Patient with long-standing, uncontrolled, type 2 diabetes, very high doses of premixed and  regular insulin at last visit, which became insufficient.  She was taking at that time 470 units of insulin a day.  She told me that she would like to come off insulin and we discussed that this is improbable due to the very high dose that she is using now.  However, we did discuss about o different options to be able to decrease the doses.  We change her regimen completely at last visit, by starting metformin, Jardiance, and switching to U500 insulin.  At that time, we also discussed about a GLP-1 receptor agonist but she tried 3 medications in this class and developed injection site reactions to all of them. At last visit, we also discussed about the concept of insulin resistance and how to improve this.  I suggested to limit fat in her diet and given specific examples of healthier meals.  She and her husband were eating almost all of their meals out, which is a large hurdle in obtaining diabetes control.  We discussed that if she cannot improve her diet as w suggested, I can also refer her to the [facility] weight loss clinic. - sugars are at or close to goal later in the day, but high in am due to the snack at night. She finishes work (at home) at 10 pm, checks sugars (at goal) and then has a snack which she is not covering with insulin >> sugars are high in am. We discussed about covering the snack with insulin (30-40 units) or switch to a lower carb snack, e.g. Nuts. She will try both options. Will increase the insulin with a larger lunch, also as sometimes sugars before dinner are high. - I suggested to:  Patient Instructions  Please continue: - Metformin 1000 mg 2x a day. - Jardiance 25 mg daily before b'fast.  Please use: - U500 insulin: - 150 units before b'fast - 75-85 units before lunch - 100 units before dinner  Please use: - 30-40 units before a snack  Please have your kidney tests and potassium checked by PCP.  Please return in 3 months with your sugar log.   - today, HbA1c is 7.1%  (improved) - continue checking sugars at different times of the day - check 3x a day, rotating checks - advised for yearly eye exams >> she is UTD - will have labs by PCP (GFR and K in 1 week) - Return to clinic in 3 mo with sugar log   2. Obesity - lost 8 lbs since last visit on Jardiance  3. HL - Reviewed latest lipid panel from 04/2017: LDL at goal - Continues  Zocor without side effects.   Philemon Kingdom, MD PhD [facility]

## 2017-08-08 NOTE — Addendum Note (Signed)
Addended by: Drucilla Schmidt on: 08/08/2017 04:47 PM   Modules accepted: Orders

## 2017-08-13 DIAGNOSIS — I10 Essential (primary) hypertension: Secondary | ICD-10-CM | POA: Diagnosis not present

## 2017-08-13 DIAGNOSIS — E559 Vitamin D deficiency, unspecified: Secondary | ICD-10-CM | POA: Diagnosis not present

## 2017-08-13 DIAGNOSIS — E1165 Type 2 diabetes mellitus with hyperglycemia: Secondary | ICD-10-CM | POA: Diagnosis not present

## 2017-08-13 DIAGNOSIS — E785 Hyperlipidemia, unspecified: Secondary | ICD-10-CM | POA: Diagnosis not present

## 2017-08-13 DIAGNOSIS — Z79899 Other long term (current) drug therapy: Secondary | ICD-10-CM | POA: Diagnosis not present

## 2017-09-01 ENCOUNTER — Other Ambulatory Visit: Payer: Self-pay | Admitting: Internal Medicine

## 2017-09-01 DIAGNOSIS — Z1231 Encounter for screening mammogram for malignant neoplasm of breast: Secondary | ICD-10-CM

## 2017-09-24 ENCOUNTER — Ambulatory Visit
Admission: RE | Admit: 2017-09-24 | Discharge: 2017-09-24 | Disposition: A | Discharge status: Home / Self Care | Payer: 59 | Source: Ambulatory Visit | Attending: Internal Medicine | Admitting: Internal Medicine

## 2017-09-24 DIAGNOSIS — Z1231 Encounter for screening mammogram for malignant neoplasm of breast: Secondary | ICD-10-CM

## 2017-10-13 ENCOUNTER — Other Ambulatory Visit: Payer: Self-pay | Admitting: Internal Medicine

## 2017-10-30 DIAGNOSIS — E119 Type 2 diabetes mellitus without complications: Secondary | ICD-10-CM | POA: Diagnosis not present

## 2017-10-30 DIAGNOSIS — E7800 Pure hypercholesterolemia, unspecified: Secondary | ICD-10-CM | POA: Diagnosis not present

## 2017-10-30 DIAGNOSIS — N390 Urinary tract infection, site not specified: Secondary | ICD-10-CM | POA: Diagnosis not present

## 2017-11-07 ENCOUNTER — Other Ambulatory Visit: Payer: Self-pay | Admitting: Internal Medicine

## 2017-11-28 ENCOUNTER — Ambulatory Visit: Payer: 59 | Admitting: Internal Medicine

## 2017-12-14 ENCOUNTER — Other Ambulatory Visit: Payer: Self-pay | Admitting: Internal Medicine

## 2017-12-14 DIAGNOSIS — E7800 Pure hypercholesterolemia, unspecified: Secondary | ICD-10-CM | POA: Diagnosis not present

## 2017-12-14 DIAGNOSIS — M62830 Muscle spasm of back: Secondary | ICD-10-CM | POA: Diagnosis not present

## 2017-12-14 DIAGNOSIS — E119 Type 2 diabetes mellitus without complications: Secondary | ICD-10-CM | POA: Diagnosis not present

## 2017-12-14 DIAGNOSIS — M62838 Other muscle spasm: Secondary | ICD-10-CM | POA: Diagnosis not present

## 2018-01-07 ENCOUNTER — Encounter: Payer: 59 | Admitting: Internal Medicine

## 2018-01-18 ENCOUNTER — Other Ambulatory Visit: Payer: Self-pay | Admitting: Nurse Practitioner

## 2018-02-08 ENCOUNTER — Other Ambulatory Visit: Payer: Self-pay | Admitting: Internal Medicine

## 2018-02-16 ENCOUNTER — Other Ambulatory Visit: Payer: Self-pay | Admitting: Nurse Practitioner

## 2018-03-16 ENCOUNTER — Other Ambulatory Visit: Payer: Self-pay | Admitting: Nurse Practitioner

## 2018-03-20 ENCOUNTER — Other Ambulatory Visit: Payer: Self-pay | Admitting: Nurse Practitioner

## 2018-04-15 ENCOUNTER — Other Ambulatory Visit: Payer: Self-pay | Admitting: Internal Medicine

## 2018-04-30 ENCOUNTER — Other Ambulatory Visit: Payer: Self-pay | Admitting: Internal Medicine

## 2018-05-04 ENCOUNTER — Other Ambulatory Visit: Payer: Self-pay | Admitting: Internal Medicine

## 2018-05-07 ENCOUNTER — Other Ambulatory Visit: Payer: Self-pay | Admitting: Internal Medicine

## 2018-06-02 ENCOUNTER — Encounter: Payer: Self-pay | Admitting: Internal Medicine

## 2018-06-03 ENCOUNTER — Ambulatory Visit (INDEPENDENT_AMBULATORY_CARE_PROVIDER_SITE_OTHER): Payer: 59 | Admitting: Internal Medicine

## 2018-06-03 ENCOUNTER — Other Ambulatory Visit: Payer: Self-pay

## 2018-06-03 DIAGNOSIS — Z794 Long term (current) use of insulin: Secondary | ICD-10-CM

## 2018-06-03 DIAGNOSIS — E785 Hyperlipidemia, unspecified: Secondary | ICD-10-CM

## 2018-06-03 DIAGNOSIS — E11319 Type 2 diabetes mellitus with unspecified diabetic retinopathy without macular edema: Secondary | ICD-10-CM | POA: Diagnosis not present

## 2018-06-03 NOTE — Patient Instructions (Addendum)
Please continue: - Metformin 1000 mg 2x a day. - Jardiance 25 mg daily before b'fast. - U500 insulin: - 150 units before b'fast - 75 to 85 units before lunch - 100 units before dinner - 30-40 units before a snack  Try to move dinner later and not to snack after dinner.  Please return in 3 months with your sugar log.   Read the following books: Dr. Alyssa Grove - Program for Reversing Diabetes Rip Cyd Silence - The Engine 2 diet

## 2018-06-03 NOTE — Progress Notes (Signed)
Patient ID: [name], female   DOB: 21-Jul-1965, 53 y.o.   MRN: [id number]   Patient location: Home My location: Office  Referring Provider: Glendale Chard, MD  I connected with the patient on 06/03/18 at  9:19 EDT by a video enabled telemedicine application and verified that I am speaking with the correct person.   I discussed the limitations of evaluation and management by telemedicine and the availability of in person appointments. The patient expressed understanding and agreed to proceed.   Details of the encounter are shown below.  HPI: [name] is a 53 y.o.-year-old female,  presenting for follow-up for DM2, dx in 2006, insulin-dependent in 2015, uncontrolled, with complications (+ DR OS).  Last visit 9 months ago.  Her older brother died Nov 26, 2017.  Last hemoglobin A1c was: 02/2018: HbA1c 7.2% Lab Results  Component Value Date   HGBA1C 7.1 (A) 08/08/2017   HGBA1C (H) 01/20/2010    6.7 (NOTE)                                                                       According to the ADA Clinical Practice Recommendations for 2011, when HbA1c is used as a screening test:   >=6.5%   Diagnostic of Diabetes Mellitus           (if abnormal result  is confirmed)  5.7-6.4%   Increased risk of developing Diabetes Mellitus  References:Diagnosis and Classification of Diabetes Mellitus,Diabetes WUJW,1191,47(WGNFA 1):S62-S69 and Standards of Medical Care in         Diabetes - 2011,Diabetes Care,2011,34  (Suppl 1):S11-S61.   HGBA1C (H) 05/27/2007    8.6 (NOTE)   The ADA recommends the following therapeutic goals for glycemic   control related to Hgb A1C measurement:   Goal of Therapy:   < 7.0% Hgb A1C   Action Suggested:  > 8.0% Hgb A1C   Ref:  Diabetes Care, 22, Suppl. 1, 1999  04/21/2017: HbA1c 7.5% 01/01/2017: HbA1c 8.1% 07/17/2016: HbA1c 7.7% 04/01/2016: HbA1c 7.4%, glucose 162, C-peptide 6.3%  She was previously on the following regimen: - Novolin insulin 70/30 100 units 2x a day 30  min before meals  - R insulin 60-100 units per meal + 80-100 units at bedtime She tried Byetta >> allergy at inj site. She tried Ozempic in 26-Nov-2016 >> allergy at inj site. She tried Victoza >> allergy at inj site. She tried Metformin >> stopped but unclear why She tries [place], [place].  She is now on: - Metformin 1000 mg 2x a day. - Jardiance 25 mg daily before b'fast. - U500 insulin - misses doses - works from home, speaks on the phone: - 150 units before b'fast - 75 to 85 units before lunch - 100 units before dinner - 30-40 units before a snack   Pt checks her sugars 2x: - am: 131-244 >> 176-232, 240 >> 191-225 - 2h after b'fast: n/c >> 112-160, 180 >> 123, 309 (no insulin) - before lunch: n/c >> 98-145 >> 120-130 - 2h after lunch: n/c >> 110-152 >> 94, 180-190, 200s if forgets the insulin - before dinner: n/c >> 105-135, 194 >> 180s - 2h after dinner: n/c >> 165, 170 >> n/c - bedtime: n/c >> 115-152, 185 >> 145-150 -  nighttime: n/c Lowest sugar was 97 >> 94; she has hypoglycemia awareness around 130: Headache Highest sugar was 240 >> 309.  Glucometer: reliON  Pt's meals are mostly out - Breakfast: 2 eggs, bacon, apples, toast - Lunch: salad, sandwich  - Dinner: varies -eats out - Snacks: bedtime: chips, PB sandwich  -No CKD, last BUN/creatinine:  04/09/2017: 9/0.54, GFR 109, glucose 178 Lab Results  Component Value Date   BUN 8 10/01/2016   BUN 5 (L) 01/20/2010   CREATININE 0.61 10/01/2016   CREATININE 0.52 01/20/2010  On valsartan 320.  -+ HL; last set of lipids:  04/12/2017: 123/154/27/65 Lab Results  Component Value Date   CHOL  05/27/2007    186        ATP III CLASSIFICATION:  <200     mg/dL   Desirable  200-239  mg/dL   Borderline High  >=240    mg/dL   High   HDL 23 (L) 05/27/2007   LDLCALC  05/27/2007    90        Total Cholesterol/HDL:CHD Risk Coronary Heart Disease Risk Table                     Men   Women  1/2 Average Risk   3.4   3.3    TRIG 363 (H) 05/27/2007   CHOLHDL 8.1 05/27/2007  On simvastatin 20  - last eye exam was in spring 2018: + DR balance  - Denies numbness and tingling in her feet.  Pt has FH of DM in daughter, PGF.  She also has a history of HTN  ROS: Constitutional: + weight gain/no weight loss, no fatigue, no subjective hyperthermia, no subjective hypothermia Eyes: no blurry vision, no xerophthalmia ENT: no sore throat, no nodules palpated in neck, no dysphagia, no odynophagia, no hoarseness Cardiovascular: no CP/no SOB/no palpitations/no leg swelling Respiratory: no cough/no SOB/no wheezing Gastrointestinal: no N/no V/no D/no C/no acid reflux Musculoskeletal: no muscle aches/no joint aches Skin: no rashes, no hair loss Neurological: no tremors/no numbness/no tingling/no dizziness  I reviewed pt's medications, allergies, PMH, social hx, family hx, and changes were documented in the history of present illness. Otherwise, unchanged from my initial visit note.  Past Medical History:  Diagnosis Date  . Claustrophobia   . Dental crown present    lower right  . Hyperlipidemia   . Hypertension    states under control with meds., has been on med. x 26 yrs.; states blood pressure goes up when she gets nervous  . Insulin dependent diabetes mellitus ([place])   . Migraines   . Obesity   . Palpitations   . Trigger thumb of right hand 09/2016   Past Surgical History:  Procedure Laterality Date  . ABDOMINAL HYSTERECTOMY     partial  . CARDIAC CATHETERIZATION  05/27/2007   normal coronary arteries  . CARPAL TUNNEL RELEASE Bilateral   . CHOLECYSTECTOMY  1991  . DE QUERVAIN'S RELEASE Right   . GANGLION CYST EXCISION Left    wrist  . INGUINAL HERNIA REPAIR Right   . SOFT TISSUE MASS EXCISION Right 09/04/2009   gluteus  . TRIGGER FINGER RELEASE Right 10/03/2016   Procedure: RELEASE TRIGGER FINGER/A-1 PULLEY RIGHT THUMB;  Surgeon: Leanora Cover, MD;  Location: [facility];  Service:  Orthopedics;  Laterality: Right;  . UTERINE SUSPENSION     Social History   Socioeconomic History  . Marital status: Married    Spouse name: Not on file  . Number of  children: 2  . Years of education: Associates  . Highest education level: Not on file  Occupational History  . Occupation: Nurse  Social Needs  . Financial resource strain: Not on file  . Food insecurity:    Worry: Not on file    Inability: Not on file  . Transportation needs:    Medical: Not on file    Non-medical: Not on file  Tobacco Use  . Smoking status: Former Smoker    Last attempt to quit: 02/03/1997    Years since quitting: 21.3  . Smokeless tobacco: Never Used  Substance and Sexual Activity  . Alcohol use: Yes    Comment: occasionally  . Drug use: No  . Sexual activity: Not on file  Lifestyle  . Physical activity:    Days per week: Not on file    Minutes per session: Not on file  . Stress: Not on file  Relationships  . Social connections:    Talks on phone: Not on file    Gets together: Not on file    Attends religious service: Not on file    Active member of club or organization: Not on file    Attends meetings of clubs or organizations: Not on file    Relationship status: Not on file  . Intimate partner violence:    Fear of current or ex partner: Not on file    Emotionally abused: Not on file    Physically abused: Not on file    Forced sexual activity: Not on file  Other Topics Concern  . Not on file  Social History Narrative   Lives at home with husband.   Right-handed.   Occasional use of caffeine.   Current Outpatient Medications on File Prior to Visit  Medication Sig Dispense Refill  . amLODipine (NORVASC) 5 MG tablet Take 5 mg by mouth daily.    Marland Kitchen HUMULIN R U-500 KWIKPEN 500 UNIT/ML kwikpen INJECT UP TO 350 UNITS A DAY UNDER SKIN AS ADVISED 60 pen 1  . insulin NPH-regular Human (NOVOLIN 70/30) (70-30) 100 UNIT/ML injection Inject 100 Units into the skin daily with breakfast.     . JARDIANCE 25 MG TABS tablet TAKE 25 MG BY MOUTH DAILY. 90 tablet 0  . meclizine (ANTIVERT) 25 MG tablet Take 25 mg by mouth 3 (three) times daily as needed for dizziness.    . metFORMIN (GLUCOPHAGE) 1000 MG tablet TAKE 1 TABLET (1,000 MG TOTAL) BY MOUTH 2 (TWO) TIMES DAILY WITH A MEAL. 180 tablet 1  . metoprolol (LOPRESSOR) 50 MG tablet daily.    . simvastatin (ZOCOR) 20 MG tablet Take 20 mg by mouth daily.    . SUMAtriptan (IMITREX) 50 MG tablet Take 1 tablet (50 mg total) by mouth every 2 (two) hours as needed for migraine. May repeat in 2 hours if headache persists or recurs. 12 tablet 11  . valsartan-hydrochlorothiazide (DIOVAN-HCT) 320-12.5 MG tablet Take 1 tablet by mouth daily.     No current facility-administered medications on file prior to visit.    Allergies  Allergen Reactions  . Azo [Phenazopyridine] Hives and Swelling    FACIAL SWELLING, CHEST TIGHTNESS  . Morphine And Related Nausea And Vomiting and Other (See Comments)    DIZZINESS  . Opium Itching and Nausea And Vomiting    OPIODS/NARCOTICS - ITCHING OF NOSE, DIZZINESS  . Sulfa Antibiotics Rash   Family History  Problem Relation Age of Onset  . Cancer Other   . Hypertension Other   . Hyperlipidemia  Other   . Heart attack Other   . Cancer Mother        Colorectal  . Heart disease Father   . Heart attack Father   . Cancer Sister        Colorectal  . Cancer Brother        Colorectal  . Heart disease Brother   . Heart attack Brother   . Heart disease Paternal Grandmother   . Heart disease Paternal Grandfather     PE: There were no vitals taken for this visit. Wt Readings from Last 3 Encounters:  08/08/17 257 lb 12.8 oz (116.9 kg)  06/18/17 265 lb (120.2 kg)  10/03/16 251 lb (113.9 kg)   Constitutional:  in NAD  The physical exam was not performed (virtual visit).  ASSESSMENT: 1. DM2, insulin-dependent, uncontrolled, with complications - DR OS  2. Obesity  PLAN:  1. Patient with longstanding,  uncontrolled, type 2 diabetes, on concentrated insulin, SGLT 2 inhibitor, and metformin, with improved control at last visit and an HbA1c of 7.1%.  However, she was lost to follow-up afterwards and now returns after 9 months. -She was telling me in the past that she wanted to come off insulin and we discussed that this is improbable due to the very high doses that she was using.  However, we did discuss about improving her diet and gave her specific examples of healthier meals.  She and her husband were eating out almost all of their meals which was a large hurdle in controlling her diabetes.  We discussed about possible referral to the [facility] weight management clinic, but we did not pursue this in the past. -At last visit, sugars were close to goal later in the day but high in the morning due to snacking at night.  She was not covering this with insulin and we discussed about the importance of doing so.  We also increased her insulin with lunch at last visit as sometimes sugars before dinner were high. -At this visit, sugars are above target as she is forgetting many insulin doses.  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  She does not have time to stop and take the injections before a meal, but we did discuss about setting alarms on her phone to schedule these.  I do not feel that we can get control of her diabetes if she does not take her insulin consistently.  Her sugars in the morning are high and this is likely due to snacks at night.  We discussed about switching from chips and other high-calorie snacks to either no snacks or low carb alternatives: nuts, celery, carrots, etc. -I do not feel that she needs any other changes for now, but we did discuss about switching to a more plant-based diet and she is interested in trying this.  Given her references. - I suggested to:  Patient Instructions  Please continue: - Metformin 1000 mg 2x a day. - Jardiance  25 mg daily before b'fast. - U500 insulin: - 150 units before b'fast - 75 to 85 units before lunch - 100 units before dinner - 30-40 units before a snack  Try to move dinner later and not to snack after dinner.  Please return in 3 months with your sugar log.   Read the following books: Dr. Alyssa Grove - Program for Reversing Diabetes Rip Cyd Silence - The Engine 2 diet  -We will check  her HbA1c when she returns to the clinic - continue checking sugars at different times of the day - check 3x a day, rotating checks - advised for yearly eye exams >> she is not UTD - Return to clinic in 3 mo with sugar log   2. Obesity -She lost 8 pounds before last visit after starting Jardiance -we will continue - gained 5 lbs since last OV - recommended a plant-based diet - given referrences  3. HL - Reviewed latest lipid panel from 04/2017: LDL at goal - Continues Zocor without side effects. - She needs labs at next visit   Philemon Kingdom, MD PhD [facility] Endocrinology

## 2018-06-30 ENCOUNTER — Other Ambulatory Visit: Payer: Self-pay | Admitting: Internal Medicine

## 2018-07-28 ENCOUNTER — Other Ambulatory Visit: Payer: Self-pay | Admitting: Internal Medicine

## 2018-09-03 IMAGING — MR MR CERVICAL SPINE W/O CM
4 of 5 series · 28 of 48 positions shown · non-contrast
Comparison: None.

CLINICAL DATA: Neck pain radiating into right arm for 2 months

EXAM:
MRI CERVICAL SPINE WITHOUT CONTRAST
TECHNIQUE: Multiplanar, multisequence MR imaging of the cervical spine was
performed. No intravenous contrast was administered.

[Series 3: tir sag · sagittal · 3.0mm · 0.41mm/px · 6 of 13 slices shown]
[im 1/13]
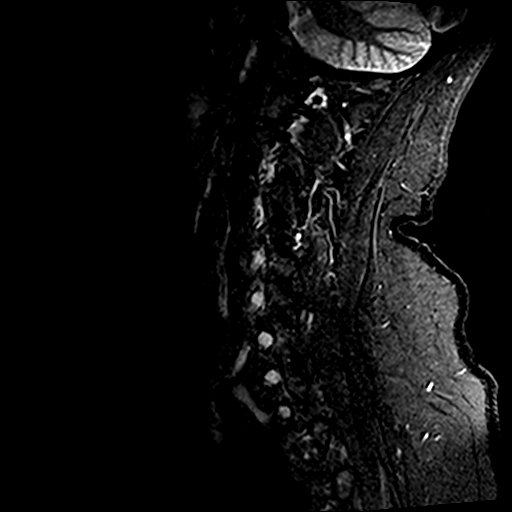
[im 3/13]
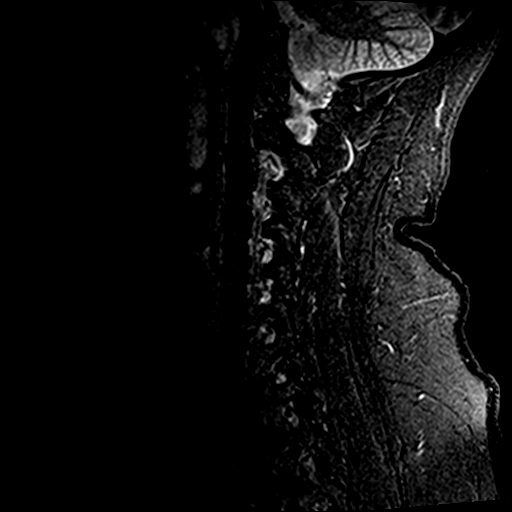
[im 5/13]
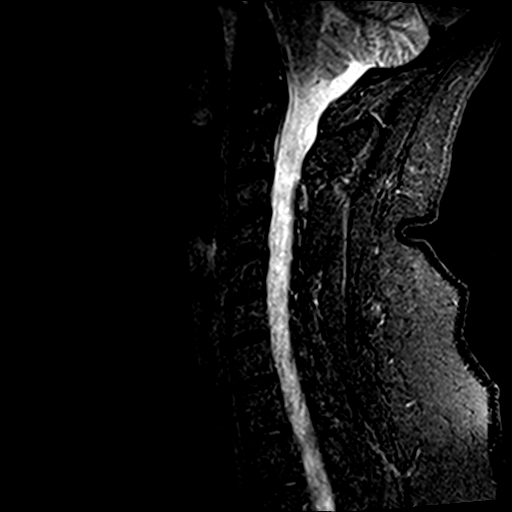
[im 8/13]
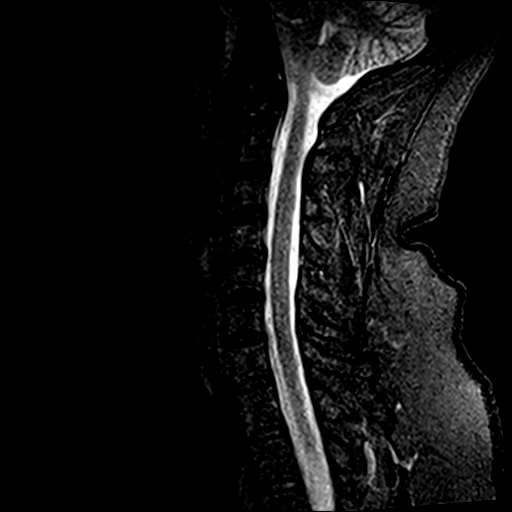
[im 10/13]
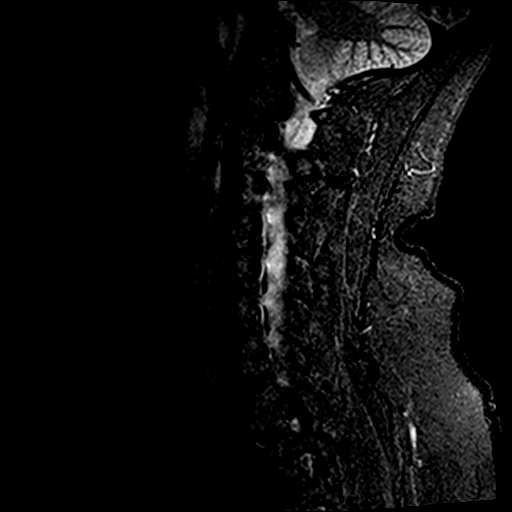
[im 13/13]
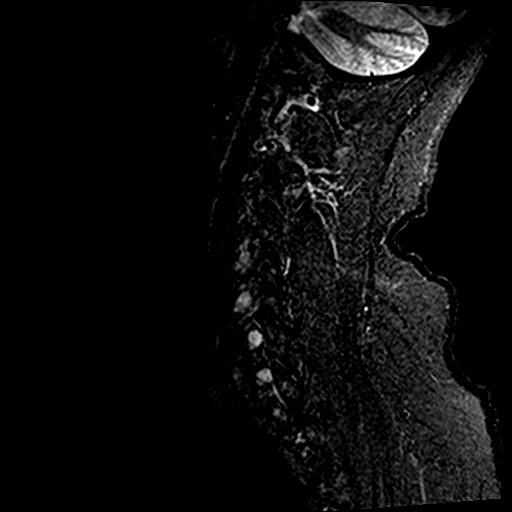

[Series 4: T1 · sagittal · 3.0mm · 0.41mm/px · 7 of 13 slices shown]
[im 1/13]
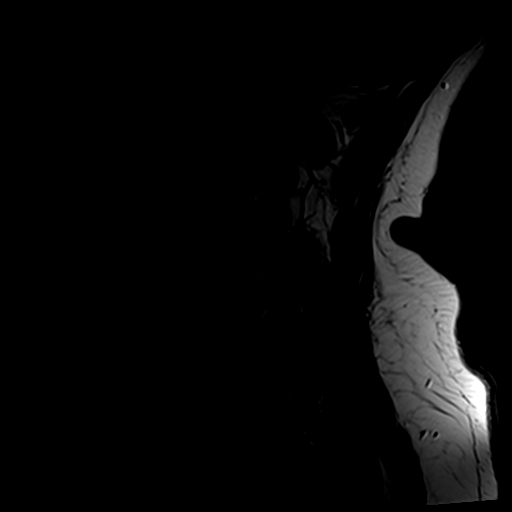
[im 3/13]
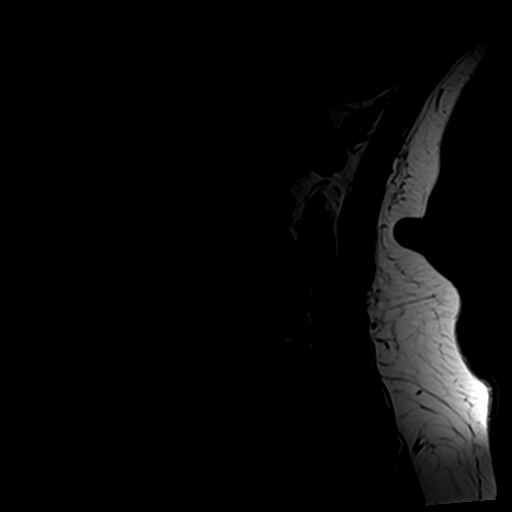
[im 5/13]
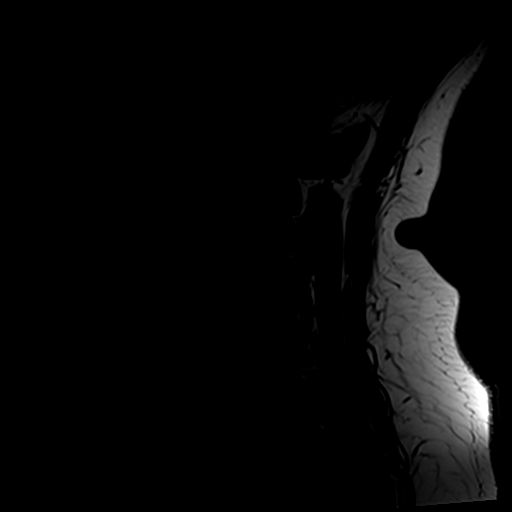
[im 7/13]
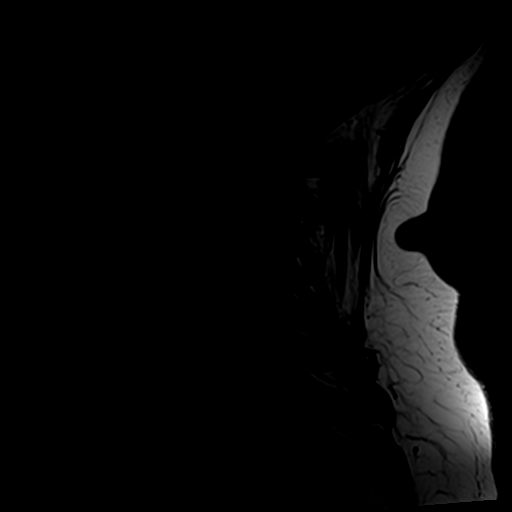
[im 9/13]
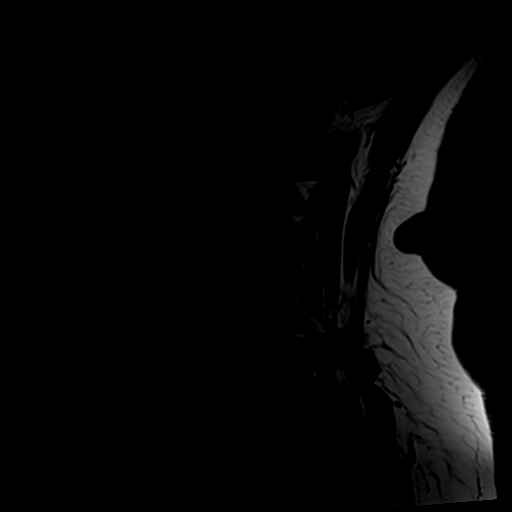
[im 11/13]
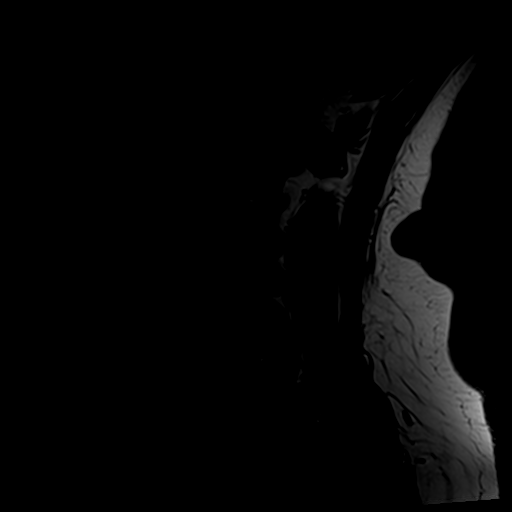
[im 13/13]
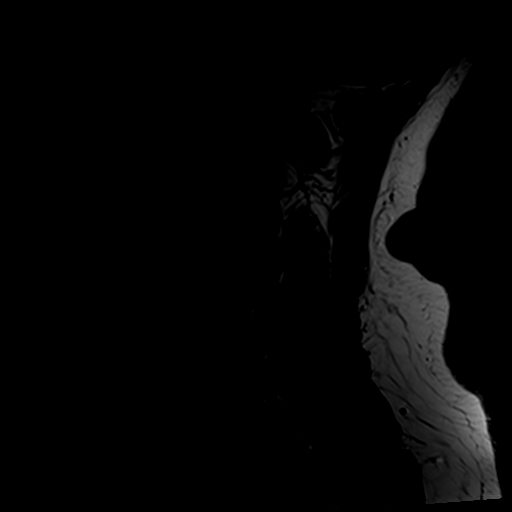

[Series 5: T2 · sagittal · 3.0mm · 0.66mm/px · 7 of 13 slices shown (1 of 2)]
[im 1/13]
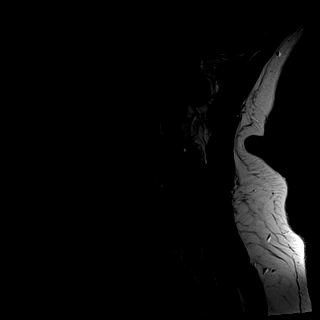
[im 3/13]
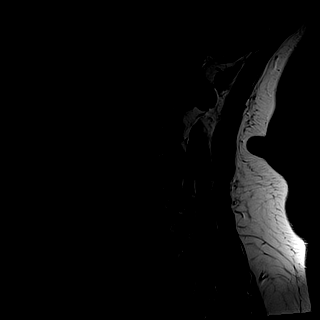
[im 5/13]
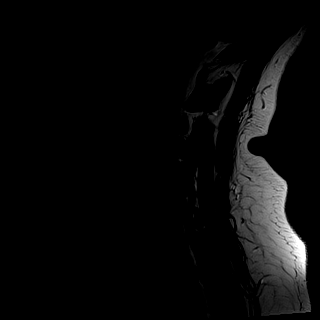
[im 7/13]
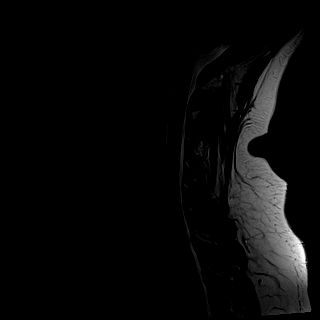
[im 9/13]
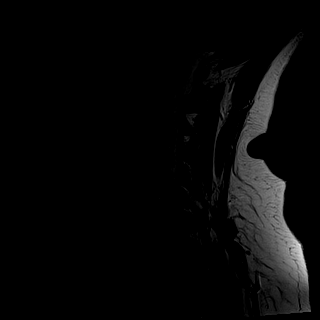
[im 11/13]
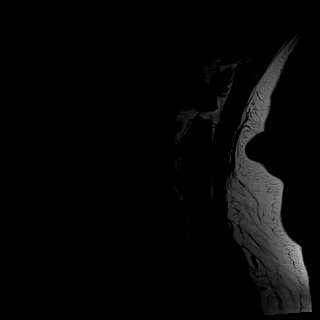
[im 13/13]
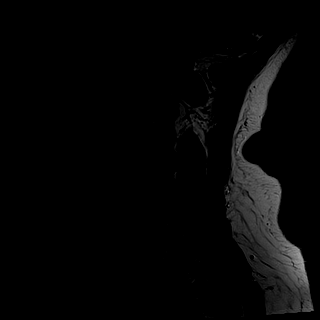

[Series 7: T2 · axial · 3.0mm · 0.70mm/px · z∈[-16,+85]mm · 8 of 28 slices shown (2 of 2)]
[im 1/28]
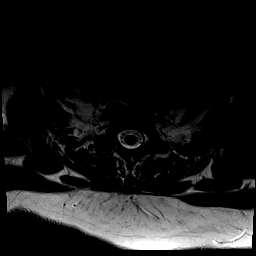
[im 5/28]
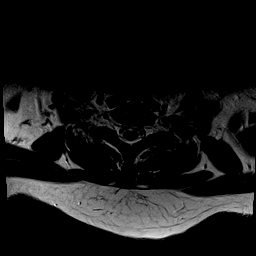
[im 9/28]
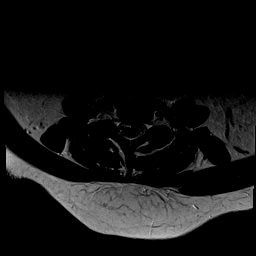
[im 13/28]
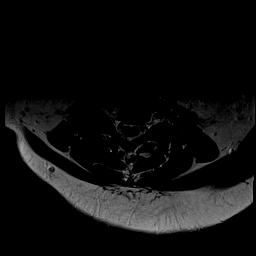
[im 15/28]
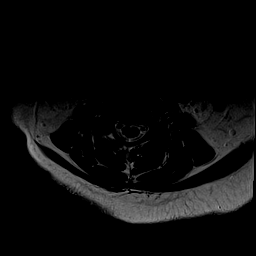
[im 19/28]
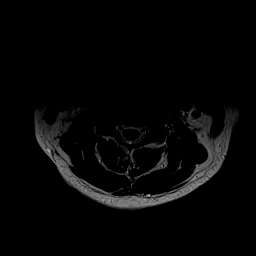
[im 23/28]
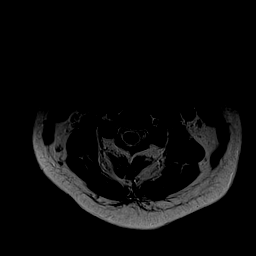
[im 28/28]
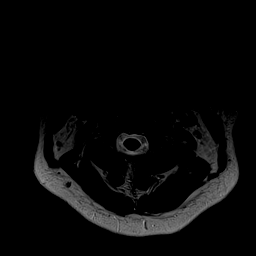

[28 of 48 positions shown; findings below may reference images not displayed]

FINDINGS: Alignment: Normal.

Vertebrae: No focal marrow lesion. No compression fracture or
evidence of discitis osteomyelitis.

Cord: Normal caliber and signal.

Posterior Fossa, vertebral arteries, paraspinal tissues: Visualized
posterior fossa is normal. Vertebral artery flow voids are
preserved. Normal visualized paraspinal soft tissues.

Disc levels:

C1-C2: Normal.

C2-C3: Normal disc space and facet joints. No spinal canal stenosis.
No neuroforaminal stenosis.

C3-C4: Normal disc space and facet joints. No spinal canal stenosis.
No neuroforaminal stenosis.

C4-C5: Normal disc space and facet joints. No spinal canal stenosis.
No neuroforaminal stenosis.

C5-C6: There is a small central disc protrusion. Mild facet
hypertrophy. No spinal canal stenosis. No neuroforaminal stenosis.

C6-C7: Normal disc space and facet joints. No spinal canal stenosis.
No neuroforaminal stenosis.

C7-T1: Normal disc space and facet joints. No spinal canal stenosis.
No neuroforaminal stenosis.

The T1-T2 level is visualized on sagittal images only. There is no
significant disc herniation, spinal canal stenosis or neuroforaminal
narrowing.
IMPRESSION: Small central disc protrusion and right greater than left facet
hypertrophy is C5-6. No spinal canal or neural foraminal stenosis.

## 2018-09-04 ENCOUNTER — Other Ambulatory Visit: Payer: Self-pay | Admitting: Internal Medicine

## 2018-11-01 ENCOUNTER — Other Ambulatory Visit: Payer: Self-pay | Admitting: Internal Medicine

## 2018-12-08 ENCOUNTER — Encounter (INDEPENDENT_AMBULATORY_CARE_PROVIDER_SITE_OTHER): Payer: 59 | Admitting: Ophthalmology

## 2018-12-14 NOTE — Progress Notes (Signed)
[facility] Note  12/16/2018     CHIEF COMPLAINT Patient presents for Diabetic Eye Exam   HISTORY OF PRESENT ILLNESS: [name] is a 53 y.o. female who presents to the clinic today for:   HPI    Diabetic Eye Exam    Vision is stable.  Diabetes characteristics include Type 2.  This started 13 years ago.  Blood sugar level is uncontrolled.  Last Blood Glucose 230 (This a.m., but had a bowl of cereal before bed last night, and does not use insulin at nighttime.).  Last A1C 6.8 (1 wk ago).  I, the attending physician,  performed the HPI with the patient and updated documentation appropriately.          Comments    53 y/o female pt referred by PCP, Dr. Chapman Fitch for [place].  LEE x 2 yrs w/Dr. Anthony Sar in [place], [place] good OU cc.  Denies pain, flashes, floaters, but has a constant "blurred spot" in her central VA OS that has been there for some time.  Pt has diabetic retinopathy OS, but has never had any treatment for it.  Pt was once a suspect for optic nerve edema OS, but saw a neuro ophthalmologist who said they didn't see anything unusual.  Pt has very dry eyes, and has frequent ophthalmic migraines.  No gtts.       Last edited by Bernarda Caffey, MD on 12/20/2018  9:26 PM. (History)    pt is here on the referral of her PCP for DM exam, pt states Dr. Anthony Sar is her regular eye dr and told her about 2 years ago she has diabetic retinopathy in her left eye, but never referred her anywhere for it, pt states Dr. Marin Comment told her she had swelling on her optic nerve awhile back and referred her to a neurologist, but they did not find anything wrong, pt states she has a constant blurred spot in her central vision OS for the past 2-3 years, pts last A1c was 6.8 last week  Referring physician: Sandi Mealy, MD [place],  [place] [id number]  HISTORICAL INFORMATION:   Selected notes from the [facility]: No current  outpatient medications on file. (Ophthalmic Drugs)   No current facility-administered medications for this visit.  (Ophthalmic Drugs)   Current Outpatient Medications (Other)  Medication Sig  . amLODipine (NORVASC) 5 MG tablet Take 5 mg by mouth daily.  . fluticasone (FLONASE) 50 MCG/ACT nasal spray Place 1 spray into both nostrils daily.  Marland Kitchen HUMULIN R U-500 KWIKPEN 500 UNIT/ML kwikpen INJECT UP TO 350 UNITS A DAY UNDER SKIN AS ADVISED  . hydrochlorothiazide (HYDRODIURIL) 12.5 MG tablet Take 12.5 mg by mouth daily.  . insulin aspart protamine - aspart (NOVOLOG MIX 70/30 FLEXPEN) (70-30) 100 UNIT/ML FlexPen Inject into the skin.  Marland Kitchen insulin NPH-regular Human (NOVOLIN 70/30) (70-30) 100 UNIT/ML injection Inject 100 Units into the skin daily with breakfast.  . JARDIANCE 25 MG TABS tablet TAKE 25 MG BY MOUTH DAILY.  . meclizine (ANTIVERT) 25 MG tablet Take 25 mg by mouth 3 (three) times daily as needed for dizziness.  . metFORMIN (GLUCOPHAGE) 1000 MG tablet TAKE 1 TABLET (1,000 MG TOTAL) BY MOUTH 2 (TWO) TIMES DAILY WITH A MEAL.  . metoprolol (LOPRESSOR) 50 MG tablet daily.  . simvastatin (ZOCOR) 20 MG tablet Take 20 mg by mouth daily.  . SUMAtriptan (IMITREX)  50 MG tablet Take 1 tablet (50 mg total) by mouth every 2 (two) hours as needed for migraine. May repeat in 2 hours if headache persists or recurs.  . valsartan-hydrochlorothiazide (DIOVAN-HCT) 320-12.5 MG tablet Take 1 tablet by mouth daily.   No current facility-administered medications for this visit.  (Other)      REVIEW OF SYSTEMS: ROS    Positive for: Endocrine, Eyes   Negative for: Constitutional, Gastrointestinal, Neurological, Skin, Genitourinary, Musculoskeletal, Cardiovascular, Respiratory, Psychiatric, Allergic/Imm, Heme/Lymph   Last edited by Matthew Folks, COA on 12/16/2018  8:30 AM. (History)       ALLERGIES Allergies  Allergen Reactions  . Azo [Phenazopyridine] Hives and Swelling    FACIAL SWELLING, CHEST  TIGHTNESS  . Morphine And Related Nausea And Vomiting and Other (See Comments)    DIZZINESS  . Opium Itching and Nausea And Vomiting    OPIODS/NARCOTICS - ITCHING OF NOSE, DIZZINESS  . Sulfa Antibiotics Rash    PAST MEDICAL HISTORY Past Medical History:  Diagnosis Date  . Claustrophobia   . Dental crown present    lower right  . Diabetic retinopathy (HCC)    OS  . Hyperlipidemia   . Hypertension    states under control with meds., has been on med. x 26 yrs.; states blood pressure goes up when she gets nervous  . Insulin dependent diabetes mellitus   . Migraines   . Obesity   . Palpitations   . Trigger thumb of right hand 09/2016   Past Surgical History:  Procedure Laterality Date  . ABDOMINAL HYSTERECTOMY     partial  . CARDIAC CATHETERIZATION  05/27/2007   normal coronary arteries  . CARPAL TUNNEL RELEASE Bilateral   . CHOLECYSTECTOMY  1991  . DE QUERVAIN'S RELEASE Right   . GANGLION CYST EXCISION Left    wrist  . INGUINAL HERNIA REPAIR Right   . SOFT TISSUE MASS EXCISION Right 09/04/2009   gluteus  . TRIGGER FINGER RELEASE Right 10/03/2016   Procedure: RELEASE TRIGGER FINGER/A-1 PULLEY RIGHT THUMB;  Surgeon: Leanora Cover, MD;  Location: [facility];  Service: Orthopedics;  Laterality: Right;  . UTERINE SUSPENSION      FAMILY HISTORY Family History  Problem Relation Age of Onset  . Cancer Other   . Hypertension Other   . Hyperlipidemia Other   . Heart attack Other   . Cancer Mother        Colorectal  . Heart disease Father   . Heart attack Father   . Cancer Sister        Colorectal  . Cancer Brother        Colorectal  . Heart disease Brother   . Heart attack Brother   . Heart disease Paternal Grandmother   . Heart disease Paternal Grandfather     SOCIAL HISTORY Social History   Tobacco Use  . Smoking status: Former Smoker    Quit date: 02/03/1997    Years since quitting: 21.8  . Smokeless tobacco: Never Used  Substance Use Topics   . Alcohol use: Yes    Comment: occasionally  . Drug use: No         OPHTHALMIC EXAM:  Base Eye Exam    Visual Acuity (Snellen - Linear)      Right Left   Dist cc 20/20 20/25   Dist ph cc  NI   Correction: Glasses       Tonometry (Tonopen, 8:34 AM)      Right Left  Pressure 17 16       Pupils      Dark Light Shape React APD   Right 4 3 Round Brisk None   Left 4 3 Round Brisk None       Visual Fields (Counting fingers)      Left Right    Full Full       Extraocular Movement      Right Left    Full, Ortho Full, Ortho       Neuro/Psych    Oriented x3: Yes   Mood/Affect: Normal       Dilation    Both eyes: 1.0% Mydriacyl, 2.5% Phenylephrine @ 8:34 AM        Slit Lamp and Fundus Exam    Slit Lamp Exam      Right Left   Lids/Lashes Dermatochalasis - upper lid Dermatochalasis - upper lid   Conjunctiva/Sclera White and quiet temporal Pinguecula   Cornea Trace Punctate epithelial erosions Trace Punctate epithelial erosions   Anterior Chamber Deep and quiet Deep and quiet   Iris Round and dilated, No NVI Round and dilated, No NVI   Lens 2+ Nuclear sclerosis, 2+ Cortical cataract 2+ Nuclear sclerosis, 2+ Cortical cataract   Vitreous mild Vitreous syneresis mild Vitreous syneresis       Fundus Exam      Right Left   Disc Pink and Sharp Sharp rim   C/D Ratio 0.5 0.6   Macula Flat, Good foveal reflex, rare MA, no edema Flat, Blunted foveal reflex, mild cluster of Microaneurysms centrally and mild macular cyst   Vessels Vascular attenuation, +AV crossing changes, mild Tortuousity Vascular attenuation, +AV crossing changes, mild Tortuousity   Periphery Attahed, Rare peripheral IRH/DBH greatest temporally Attahed, scattered IRH/DBH 360        Refraction    Wearing Rx      Sphere Cylinder   Right -1.00 Sphere   Left -1.75 Sphere   Age: 62yr   Type: SVL       Manifest Refraction      Sphere Cylinder Dist VA   Right -1.00 Sphere 20/20   Left -2.00  Sphere 20/25          IMAGING AND PROCEDURES  Imaging and Procedures for @TODAY @  OCT, Retina - OU - Both Eyes       Right Eye Quality was good. Central Foveal Thickness: 311. Progression has no prior data. Findings include normal foveal contour, no IRF, no SRF.   Left Eye Quality was good. Central Foveal Thickness: 403. Progression has no prior data. Findings include abnormal foveal contour, intraretinal fluid, no SRF, intraretinal hyper-reflective material (Large, central inter retinal cyst).   Notes *Images captured and stored on drive  Diagnosis / Impression:  OD: NFP, no IRF/SRF OS: +DME  Clinical management:  See below  Abbreviations: NFP - Normal foveal profile. CME - cystoid macular edema. PED - pigment epithelial detachment. IRF - intraretinal fluid. SRF - subretinal fluid. EZ - ellipsoid zone. ERM - epiretinal membrane. ORA - outer retinal atrophy. ORT - outer retinal tubulation. SRHM - subretinal hyper-reflective material        Fluorescein Angiography Optos (Transit OS)       Right Eye   Progression has no prior data. Early phase findings include microaneurysm. Mid/Late phase findings include microaneurysm, leakage (Late leaking peripheral MA's greatest temporal periphery).   Left Eye   Progression has no prior data. Early phase findings include delayed filling, microaneurysm. Mid/Late phase findings include microaneurysm, leakage (  Late leaking MA's mostly peripherally, but central concentration of late leaking MA's parafovea).   Notes *Images captured and stored on drive  Diagnosis / Impression:  Moderate NPDR OU Late leaking MA's OU -- OS with central cluster of late leaking MA No NV OU  Clinical management:  See below  Abbreviations: NFP - Normal foveal profile. CME - cystoid macular edema. PED - pigment epithelial detachment. IRF - intraretinal fluid. SRF - subretinal fluid. EZ - ellipsoid zone. ERM - epiretinal membrane. ORA - outer retinal  atrophy. ORT - outer retinal tubulation. SRHM - subretinal hyper-reflective material                 ASSESSMENT/PLAN:    ICD-10-CM   1. Moderate nonproliferative diabetic retinopathy of both eyes with macular edema associated with type 2 diabetes mellitus ([place])  [id number]   2. Retinal edema  H35.81 OCT, Retina - OU - Both Eyes  3. Essential hypertension  I10   4. Hypertensive retinopathy of both eyes  H35.033 Fluorescein Angiography Optos (Transit OS)  5. Combined forms of age-related cataract of both eyes  H25.813     1,2. Moderate Non-proliferative diabetic retinopathy, OU (OS>OD)  - The incidence, risk factors for progression, natural history and treatment options for diabetic retinopathy were discussed with patient.    - The need for close monitoring of blood glucose, blood pressure, and serum lipids, avoiding cigarette or any type of tobacco, and the need for long term follow up was also discussed with patient.  - exam shows mild central MA OU and scattered IRH/DBH OU  - FA (11.11.20) shows late leaking MA's OU; OS with central cluster of late leaking MA's  - OCT shows diabetic macular edema OS  - The natural history, pathology, and characteristics of diabetic macular edema discussed with patient.  A generalized discussion of the major clinical trials concerning treatment of diabetic macular edema (ETDRS, DCT, SCORE, RISE / RIDE, and ongoing DRCR net studies) was completed.  This discussion included mention of the various approaches to treating diabetic macular edema (observation, laser photocoagulation, anti-VEGF injections with lucentis / Avastin / Eylea, steroid injections with Kenalog / Ozurdex, and intraocular surgery with vitrectomy).  The goal hemoglobin A1C of 6-7 was discussed, as well as importance of smoking cessation and hypertension control.  Need for ongoing treatment and monitoring were specifically discussed with reference to chronic nature of diabetic macular  edema.  - recommend IVA #1 OS today, 11.11.20  - after discussion of RBA of procedure, pt wishes to hold off on injection today and return in 2 wks for treatment  - f/u in 2 wks -- OCT and IVA OS #1  3,4. Hypertensive retinopathy OU  - discussed importance of tight BP control  - monitor  5. Mixed form age related cataract OU  - The symptoms of cataract, surgical options, and treatments and risks were discussed with patient.  - discussed diagnosis and progression  - not yet visually significant  - monitor for now   Ophthalmic Meds Ordered this visit:  No orders of the defined types were placed in this encounter.      Return in about 2 weeks (around 12/30/2018) for f/u NPDR OU, DFE, OCT.  There are no Patient Instructions on file for this visit.   This document serves as a record of services personally performed by Gardiner Sleeper, MD, PhD. It was created on their behalf by Roselee Nova, COMT. The creation of this record is the provider's dictation and/or  activities during the visit.  Electronically signed by: Roselee Nova, COMT 12/20/18 9:31 PM   Explained the diagnoses, plan, and follow up with the patient and they expressed understanding.  Patient expressed understanding of the importance of proper follow up care.   Gardiner Sleeper, M.D., Ph.D. Diseases & Surgery of the Retina and Vitreous [facility]  I have reviewed the above documentation for accuracy and completeness, and I agree with the above. Gardiner Sleeper, M.D., Ph.D. 12/20/18 9:31 PM    Abbreviations: M myopia (nearsighted); A astigmatism; H hyperopia (farsighted); P presbyopia; Mrx spectacle prescription;  CTL contact lenses; OD right eye; OS left eye; OU both eyes  XT exotropia; ET esotropia; PEK punctate epithelial keratitis; PEE punctate epithelial erosions; DES dry eye syndrome; MGD meibomian gland dysfunction; ATs artificial tears; PFAT's preservative free artificial tears; [place]  nuclear sclerotic cataract; PSC posterior subcapsular cataract; ERM epi-retinal membrane; PVD posterior vitreous detachment; RD retinal detachment; DM diabetes mellitus; DR diabetic retinopathy; NPDR non-proliferative diabetic retinopathy; PDR proliferative diabetic retinopathy; CSME clinically significant macular edema; DME diabetic macular edema; dbh dot blot hemorrhages; CWS cotton wool spot; POAG primary open angle glaucoma; C/D cup-to-disc ratio; HVF humphrey visual field; GVF goldmann visual field; OCT optical coherence tomography; IOP intraocular pressure; BRVO Branch retinal vein occlusion; CRVO central retinal vein occlusion; CRAO central retinal artery occlusion; BRAO branch retinal artery occlusion; RT retinal tear; SB scleral buckle; PPV pars plana vitrectomy; VH Vitreous hemorrhage; PRP panretinal laser photocoagulation; IVK intravitreal kenalog; VMT vitreomacular traction; MH Macular hole;  NVD neovascularization of the disc; NVE neovascularization elsewhere; AREDS age related eye disease study; ARMD age related macular degeneration; POAG primary open angle glaucoma; EBMD epithelial/anterior basement membrane dystrophy; ACIOL anterior chamber intraocular lens; IOL intraocular lens; PCIOL posterior chamber intraocular lens; Phaco/IOL phacoemulsification with intraocular lens placement; [place] photorefractive keratectomy; LASIK laser assisted in situ keratomileusis; HTN hypertension; DM diabetes mellitus; COPD chronic obstructive pulmonary disease

## 2018-12-16 ENCOUNTER — Ambulatory Visit (INDEPENDENT_AMBULATORY_CARE_PROVIDER_SITE_OTHER): Payer: 59 | Admitting: Ophthalmology

## 2018-12-16 ENCOUNTER — Encounter (INDEPENDENT_AMBULATORY_CARE_PROVIDER_SITE_OTHER): Payer: Self-pay | Admitting: Ophthalmology

## 2018-12-16 DIAGNOSIS — E113313 Type 2 diabetes mellitus with moderate nonproliferative diabetic retinopathy with macular edema, bilateral: Secondary | ICD-10-CM

## 2018-12-16 DIAGNOSIS — H25813 Combined forms of age-related cataract, bilateral: Secondary | ICD-10-CM

## 2018-12-16 DIAGNOSIS — H35033 Hypertensive retinopathy, bilateral: Secondary | ICD-10-CM

## 2018-12-16 DIAGNOSIS — I10 Essential (primary) hypertension: Secondary | ICD-10-CM

## 2018-12-16 DIAGNOSIS — H3581 Retinal edema: Secondary | ICD-10-CM

## 2018-12-20 ENCOUNTER — Encounter (INDEPENDENT_AMBULATORY_CARE_PROVIDER_SITE_OTHER): Payer: Self-pay | Admitting: Ophthalmology

## 2018-12-23 ENCOUNTER — Other Ambulatory Visit: Payer: Self-pay | Admitting: Internal Medicine

## 2018-12-30 ENCOUNTER — Encounter (INDEPENDENT_AMBULATORY_CARE_PROVIDER_SITE_OTHER): Payer: 59 | Admitting: Ophthalmology

## 2019-03-21 ENCOUNTER — Other Ambulatory Visit: Payer: Self-pay | Admitting: Internal Medicine

## 2019-06-21 ENCOUNTER — Other Ambulatory Visit: Payer: Self-pay | Admitting: Internal Medicine

## 2019-06-23 ENCOUNTER — Other Ambulatory Visit: Payer: Self-pay | Admitting: Internal Medicine

## 2019-06-23 ENCOUNTER — Telehealth: Payer: Self-pay | Admitting: Internal Medicine

## 2019-06-23 MED ORDER — JARDIANCE 25 MG PO TABS: ORAL_TABLET | ORAL | 0 refills | Status: DC

## 2019-06-23 MED ORDER — METFORMIN HCL 1000 MG PO TABS: 1000.0000 mg | ORAL_TABLET | Freq: Two times a day (BID) | ORAL | 0 refills | Status: DC

## 2019-06-23 NOTE — Telephone Encounter (Signed)
Medication Refill Request  Did you call your pharmacy and request this refill first? Yes   If patient has not contacted pharmacy first, instruct them to do so for future refills.   Remind them that contacting the pharmacy for their refill is the quickest method to get the refill.   Refill policy also stated that it will take anywhere between 24-72 hours to receive the refill.    Name of medication? jardiance and metformin  Is this a 90 day supply? yes  Name and location of pharmacy?  [id number] - [place], [place] Phone:  [phone]  Fax:  [phone]

## 2019-06-23 NOTE — Telephone Encounter (Signed)
Last office visit with [place] 05/25/2018.  One refill of each sent.

## 2019-06-25 ENCOUNTER — Telehealth (INDEPENDENT_AMBULATORY_CARE_PROVIDER_SITE_OTHER): Payer: 59 | Admitting: Internal Medicine

## 2019-06-25 ENCOUNTER — Encounter: Payer: Self-pay | Admitting: Internal Medicine

## 2019-06-25 ENCOUNTER — Other Ambulatory Visit: Payer: Self-pay

## 2019-06-25 DIAGNOSIS — E785 Hyperlipidemia, unspecified: Secondary | ICD-10-CM

## 2019-06-25 DIAGNOSIS — Z794 Long term (current) use of insulin: Secondary | ICD-10-CM

## 2019-06-25 DIAGNOSIS — B373 Candidiasis of vulva and vagina: Secondary | ICD-10-CM

## 2019-06-25 DIAGNOSIS — E11319 Type 2 diabetes mellitus with unspecified diabetic retinopathy without macular edema: Secondary | ICD-10-CM | POA: Diagnosis not present

## 2019-06-25 DIAGNOSIS — B3731 Acute candidiasis of vulva and vagina: Secondary | ICD-10-CM

## 2019-06-25 MED ORDER — METFORMIN HCL 1000 MG PO TABS: 1000.0000 mg | ORAL_TABLET | Freq: Two times a day (BID) | ORAL | 3 refills | Status: DC

## 2019-06-25 MED ORDER — JARDIANCE 25 MG PO TABS: ORAL_TABLET | ORAL | 3 refills | Status: AC

## 2019-06-25 MED ORDER — FLUCONAZOLE 150 MG PO TABS: 150.0000 mg | ORAL_TABLET | Freq: Once | ORAL | 1 refills | Status: AC

## 2019-06-25 NOTE — Patient Instructions (Addendum)
Please continue: - Metformin 1000 mg 2x a day. - Jardiance 25 mg daily before b'fast.  Please return in 6 months with your sugar log.

## 2019-06-25 NOTE — Progress Notes (Addendum)
Patient ID: [name], female   DOB: 1965-09-05, 54 y.o.   MRN: [id number]   Patient location: Home My location: Office Persons participating in the virtual visit: patient, provider  Referring Provider: Leeanne Rio, MD  I connected with the patient on 06/25/19 at  8:15 AM EDT by telephone and verified that I am speaking with the correct person.   I discussed the limitations of evaluation and management by telephone and the availability of in person appointments. The patient expressed understanding and agreed to proceed.   Details of the encounter are shown below.  HPI: [name] is a 54 y.o.-year-old female,  presenting for follow-up for DM2, dx in 2006, insulin-dependent in 2015, uncontrolled, with complications (+ DR OS).  Last visit 13 months ago!  She started a keto diet in 02/2019 >> lost 31 lbs and sugars improved.   Reviewed HbShe continues to work from home.A1c levels: 04/15/2019: HbA1c 6.6%  12/03/2018: HbA1c 6.8% 02/2018: HbA1c 7.2% Lab Results  Component Value Date   HGBA1C 7.1 (A) 08/08/2017   HGBA1C (H) 01/20/2010    6.7 (NOTE)                                                                       According to the ADA Clinical Practice Recommendations for 2011, when HbA1c is used as a screening test:   >=6.5%   Diagnostic of Diabetes Mellitus           (if abnormal result  is confirmed)  5.7-6.4%   Increased risk of developing Diabetes Mellitus  References:Diagnosis and Classification of Diabetes Mellitus,Diabetes [id number] 1):S62-S69 and Standards of Medical Care in         Diabetes - 2011,Diabetes Care,2011,34  (Suppl 1):S11-S61.   HGBA1C (H) 05/27/2007    8.6 (NOTE)   The ADA recommends the following therapeutic goals for glycemic   control related to Hgb A1C measurement:   Goal of Therapy:   < 7.0% Hgb A1C   Action Suggested:  > 8.0% Hgb A1C   Ref:  Diabetes Care, 22, Suppl. 1, 1999  04/21/2017: HbA1c 7.5% 01/01/2017: HbA1c 8.1% 07/17/2016: HbA1c  7.7% 04/01/2016: HbA1c 7.4%, glucose 162, C-peptide 6.3%  She was previously on the following regimen: - Novolin insulin 70/30 100 units 2x a day 30 min before meals  - R insulin 60-100 units per meal + 80-100 units at bedtime She tried Byetta >> allergy at inj site. She tried Ozempic in 10/2016 >> allergy at inj site. She tried Victoza >> allergy at inj site. She tried Metformin >> stopped but unclear why She tries [place], [place].  She is now on: - Metformin 1000 mg 2x a day. - Jardiance 25 mg daily before b'fast. - U500 insulin >> stopped 02/2019 due to low blood sugars!  Pt checks her sugars twice a day: - am: 131-244 >> 176-232, 240 >> 191-225 >> 85-120, 171 - 2h after b'fast: 112-160, 180 >> 123, 309 >> 125-135 - before lunch: n/c >> 98-145 >> 120-130 >> n/c - 2h after lunch: 110-152 >> 94, 180-190, 200s >> n/c - before dinner: n/c >> 105-135, 194 >> 180s >> n/c - 2h after dinner: n/c >> 165, 170 >> n/c - bedtime: n/c >> 115-152, 185 >>  145-150 >> n/c - nighttime: n/c Lowest sugar was 97 >> 94 >> 85; she has hypoglycemia awareness around 130 >> 70. Highest sugar was 240 >> 309 >> 171.  Glucometer: reliON  Pt's meals  - 30-40 g carbs: - Breakfast: 2 eggs, bacon, apples, toast >> eggs + bacon - Lunch: salad, sandwich >> avocado + veggies (greens) - Dinner: varies -eats out >> red meat occasionally/ baked chicken/baked fish + veggies - Snacks: bedtime: chips, PB sandwich >> walnuts, avocado  -No CKD, last BUN/creatinine:  04/15/2019: 11/0.59 , glucose 162 9 (nonfasting) 04/09/2017: 9/0.54, GFR 109, glucose 178 Lab Results  Component Value Date   BUN 8 10/01/2016   BUN 5 (L) 01/20/2010   CREATININE 0.61 10/01/2016   CREATININE 0.52 01/20/2010  On valsartan 320.  -+ HL; last set of lipids:  04/12/2017: 123/154/27/65 Lab Results  Component Value Date   CHOL  05/27/2007    186        ATP III CLASSIFICATION:  <200     mg/dL   Desirable  200-239  mg/dL   Borderline  High  >=240    mg/dL   High   HDL 23 (L) 05/27/2007   LDLCALC  05/27/2007    90        Total Cholesterol/HDL:CHD Risk Coronary Heart Disease Risk Table                     Men   Women  1/2 Average Risk   3.4   3.3   TRIG 363 (H) 05/27/2007   CHOLHDL 8.1 05/27/2007  On simvastatin 20.  - last eye exam was in fall 2020: + DR OS, + macular edema OS. She now sees a retina specialist. Will need IO injection.  - no numbness and tingling in her feet.  Pt has FH of DM in daughter, PGF.  She also has a history of HTN, vitamin D insufficiency - started 5000 IU daily.  ROS: Constitutional: no weight gain/no weight loss, no fatigue, no subjective hyperthermia, no subjective hypothermia Eyes: no blurry vision, no xerophthalmia ENT: no sore throat, no nodules palpated in neck, no dysphagia, no odynophagia, no hoarseness Cardiovascular: no CP/no SOB/no palpitations/no leg swelling Respiratory: no cough/no SOB/no wheezing Gastrointestinal: no N/no V/no D/no C/no acid reflux Musculoskeletal: no muscle aches/no joint aches Skin: no rashes, no hair loss Neurological: no tremors/no numbness/no tingling/no dizziness  I reviewed pt's medications, allergies, PMH, social hx, family hx, and changes were documented in the history of present illness. Otherwise, unchanged from my initial visit note.  Past Medical History:  Diagnosis Date  . Claustrophobia   . Dental crown present    lower right  . Diabetic retinopathy (HCC)    OS  . Hyperlipidemia   . Hypertension    states under control with meds., has been on med. x 26 yrs.; states blood pressure goes up when she gets nervous  . Insulin dependent diabetes mellitus   . Migraines   . Obesity   . Palpitations   . Trigger thumb of right hand 09/2016   Past Surgical History:  Procedure Laterality Date  . ABDOMINAL HYSTERECTOMY     partial  . CARDIAC CATHETERIZATION  05/27/2007   normal coronary arteries  . CARPAL TUNNEL RELEASE Bilateral    . CHOLECYSTECTOMY  1991  . DE QUERVAIN'S RELEASE Right   . GANGLION CYST EXCISION Left    wrist  . INGUINAL HERNIA REPAIR Right   . SOFT TISSUE MASS EXCISION Right  09/04/2009   gluteus  . TRIGGER FINGER RELEASE Right 10/03/2016   Procedure: RELEASE TRIGGER FINGER/A-1 PULLEY RIGHT THUMB;  Surgeon: Leanora Cover, MD;  Location: [facility];  Service: Orthopedics;  Laterality: Right;  . UTERINE SUSPENSION     Social History   Socioeconomic History  . Marital status: Married    Spouse name: Not on file  . Number of children: 2  . Years of education: Associates  . Highest education level: Not on file  Occupational History  . Occupation: Nurse  Tobacco Use  . Smoking status: Former Smoker    Quit date: 02/03/1997    Years since quitting: 22.4  . Smokeless tobacco: Never Used  Substance and Sexual Activity  . Alcohol use: Yes    Comment: occasionally  . Drug use: No  . Sexual activity: Not on file  Other Topics Concern  . Not on file  Social History Narrative   Lives at home with husband.   Right-handed.   Occasional use of caffeine.   Social Determinants of Health   Financial Resource Strain:   . Difficulty of Paying Living Expenses:   Food Insecurity:   . Worried About Charity fundraiser in the Last Year:   . Arboriculturist in the Last Year:   Transportation Needs:   . Film/video editor (Medical):   Marland Kitchen Lack of Transportation (Non-Medical):   Physical Activity:   . Days of Exercise per Week:   . Minutes of Exercise per Session:   Stress:   . Feeling of Stress :   Social Connections:   . Frequency of Communication with Friends and Family:   . Frequency of Social Gatherings with Friends and Family:   . Attends Religious Services:   . Active Member of Clubs or Organizations:   . Attends Archivist Meetings:   Marland Kitchen Marital Status:   Intimate Partner Violence:   . Fear of Current or Ex-Partner:   . Emotionally Abused:   Marland Kitchen Physically  Abused:   . Sexually Abused:    Current Outpatient Medications on File Prior to Visit  Medication Sig Dispense Refill  . amLODipine (NORVASC) 5 MG tablet Take 5 mg by mouth daily.    . empagliflozin (JARDIANCE) 25 MG TABS tablet TAKE 25 MG BY MOUTH DAILY. 90 tablet 0  . fluticasone (FLONASE) 50 MCG/ACT nasal spray Place 1 spray into both nostrils daily.    Marland Kitchen HUMULIN R U-500 KWIKPEN 500 UNIT/ML kwikpen INJECT UP TO 350 UNITS A DAY UNDER SKIN AS ADVISED 60 pen 1  . hydrochlorothiazide (HYDRODIURIL) 12.5 MG tablet Take 12.5 mg by mouth daily.    . insulin aspart protamine - aspart (NOVOLOG MIX 70/30 FLEXPEN) (70-30) 100 UNIT/ML FlexPen Inject into the skin.    Marland Kitchen insulin NPH-regular Human (NOVOLIN 70/30) (70-30) 100 UNIT/ML injection Inject 100 Units into the skin daily with breakfast.    . meclizine (ANTIVERT) 25 MG tablet Take 25 mg by mouth 3 (three) times daily as needed for dizziness.    . metFORMIN (GLUCOPHAGE) 1000 MG tablet Take 1 tablet (1,000 mg total) by mouth 2 (two) times daily with a meal. 180 tablet 0  . metoprolol (LOPRESSOR) 50 MG tablet daily.    . simvastatin (ZOCOR) 20 MG tablet Take 20 mg by mouth daily.    . SUMAtriptan (IMITREX) 50 MG tablet Take 1 tablet (50 mg total) by mouth every 2 (two) hours as needed for migraine. May repeat in 2 hours if headache  persists or recurs. 12 tablet 11  . valsartan-hydrochlorothiazide (DIOVAN-HCT) 320-12.5 MG tablet Take 1 tablet by mouth daily.     No current facility-administered medications on file prior to visit.   Allergies  Allergen Reactions  . Azo [Phenazopyridine] Hives and Swelling    FACIAL SWELLING, CHEST TIGHTNESS  . Morphine And Related Nausea And Vomiting and Other (See Comments)    DIZZINESS  . Opium Itching and Nausea And Vomiting    OPIODS/NARCOTICS - ITCHING OF NOSE, DIZZINESS  . Sulfa Antibiotics Rash   Family History  Problem Relation Age of Onset  . Cancer Other   . Hypertension Other   . Hyperlipidemia  Other   . Heart attack Other   . Cancer Mother        Colorectal  . Heart disease Father   . Heart attack Father   . Cancer Sister        Colorectal  . Cancer Brother        Colorectal  . Heart disease Brother   . Heart attack Brother   . Heart disease Paternal Grandmother   . Heart disease Paternal Grandfather     PE: There were no vitals taken for this visit. Wt Readings from Last 3 Encounters:  08/08/17 257 lb 12.8 oz (116.9 kg)  06/18/17 265 lb (120.2 kg)  10/03/16 251 lb (113.9 kg)   Constitutional:  in NAD  The physical exam was not performed (phone visit).  ASSESSMENT: 1. DM2, insulin-dependent, uncontrolled, with complications - DR OS  2. Obesity class III  3.  Hyperlipidemia  4.  Yeast vaginitis  PLAN:  1. Patient with longstanding, uncontrolled, type 2 diabetes, very insulin resistant, on concentrated insulin, SGLT 2 inhibitor, and Metformin.  She returns after an absence of 13 months.  Last visit was virtual.  Latest HbA1c is actually from 02/2018, and this is slightly higher than my 7.2%.  In the past, she and her husband were eating out almost all of their meals, which is a huge hurdle in controlling her diabetes.  She did not desire referral to [facility].  At last visit we discussed about the importance of not forgetting to take the medications (she was forgetting many insulin doses) and not snacking after dinner.  We did discuss about healthy snacks alternative, in case she absolutely needs to snack.  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  I also suggested a plant-based diet at last visit and given her references.  Otherwise, we did not change the regimen. -At this visit, sugars are at goal after she started a keto diet at the beginning of the year and lost 31 pounds.  She feels much better.  She is only eating 30 to 40 g of carbs a day.  She was able to stop the U500 insulin completely in 02/2018, as she found that her sugars  were dropping on this!  We will continue off her insulin for now. - I suggested to:  Patient Instructions  Please continue: - Metformin 1000 mg 2x a day. - Jardiance 25 mg daily before b'fast.  Please return in 6 months with your sugar log.   - we will check an HbA1c when she comes back - advised to check sugars at different times of the day - 1x a day, rotating check times - advised for yearly eye exams >> she is UTD - return to clinic in 6 months  2. Obesity -Continue Jardiance which should also help  with weight loss -At last visit I recommended a plant-based diet and given her references -At this visit, she tells me she is on a keto diet, which she started at the beginning of the year.  She lost 31 pounds.  She feels much better and was able to come off the concentrated insulin, which should also help with weight loss  3. HL -Reviewed latest lipid panel from 11/2018: HDL low, the rest of the fractions at goal -Continue Zocor without side effects -She needs a new lipid panel soon.  She is on a keto diet and I explained that on a high fat diet her LDL may worsen.  4.  Yeast vaginitis -Recurred -We will send Diflucan 100 mg with 1 refill  - Total time spent for the visit: 15 min, on the phone with the patient, also obtaining medical information from the chart (including [facility] records), reviewing her  previous labs, imaging evaluations, and treatments, reviewing her symptoms, counseling her about her condition (please see the discussed topics above), and developing a plan to further investigate and treat it; she had a number of questions which I addressed.   Philemon Kingdom, MD PhD [facility] Endocrinology

## 2019-06-25 NOTE — Addendum Note (Signed)
Addended by: Philemon Kingdom on: 06/25/2019 08:44 AM   Modules accepted: Orders

## 2019-10-10 ENCOUNTER — Other Ambulatory Visit: Payer: Self-pay | Admitting: Internal Medicine

## 2019-10-10 DIAGNOSIS — E669 Obesity, unspecified: Secondary | ICD-10-CM

## 2019-10-10 DIAGNOSIS — E11319 Type 2 diabetes mellitus with unspecified diabetic retinopathy without macular edema: Secondary | ICD-10-CM

## 2019-10-10 DIAGNOSIS — U071 COVID-19: Secondary | ICD-10-CM

## 2019-10-10 NOTE — Progress Notes (Signed)
I connected by phone with [name] on 10/10/2019 at 7:08 PM to discuss the potential use of a new treatment for mild to moderate COVID-19 viral infection in non-hospitalized patients.  This patient is a 54 y.o. female that meets the FDA criteria for Emergency Use Authorization of COVID monoclonal antibody casirivimab/imdevimab.  Has a (+) direct SARS-CoV-2 viral test result  Has mild or moderate COVID-19   Is NOT hospitalized due to COVID-19  Is within 10 days of symptom onset  Has at least one of the high risk factor(s) for progression to severe COVID-19 and/or hospitalization as defined in EUA.  Specific high risk criteria : Diabetes, BMI>25, htn   I have spoken and communicated the following to the patient or parent/caregiver regarding COVID monoclonal antibody treatment:  1. FDA has authorized the emergency use for the treatment of mild to moderate COVID-19 in adults and pediatric patients with positive results of direct SARS-CoV-2 viral testing who are 62 years of age and older weighing at least 40 kg, and who are at high risk for progressing to severe COVID-19 and/or hospitalization.  2. The significant known and potential risks and benefits of COVID monoclonal antibody, and the extent to which such potential risks and benefits are unknown.  3. Information on available alternative treatments and the risks and benefits of those alternatives, including clinical trials.  4. Patients treated with COVID monoclonal antibody should continue to self-isolate and use infection control measures (e.g., wear mask, isolate, social distance, avoid sharing personal items, clean and disinfect "high touch" surfaces, and frequent handwashing) according to CDC guidelines.   5. The patient or parent/caregiver has the option to accept or refuse COVID monoclonal antibody treatment.  After reviewing this information with the patient, The patient agreed to proceed with receiving casirivimab\imdevimab  infusion and will be provided a copy of the Fact sheet prior to receiving the infusion.    Nemiah Commander, NP 10/10/2019 7:08 PM

## 2019-10-11 ENCOUNTER — Emergency Department (HOSPITAL_COMMUNITY): Payer: No Typology Code available for payment source

## 2019-10-11 ENCOUNTER — Inpatient Hospital Stay (HOSPITAL_COMMUNITY)
Admission: EM | Admit: 2019-10-11 | Discharge: 2019-10-13 | DRG: 177 | Disposition: A | Discharge status: Home / Self Care | Payer: No Typology Code available for payment source | Attending: Family Medicine | Admitting: Family Medicine

## 2019-10-11 ENCOUNTER — Encounter (HOSPITAL_COMMUNITY): Payer: Self-pay | Admitting: *Deleted

## 2019-10-11 ENCOUNTER — Other Ambulatory Visit: Payer: Self-pay

## 2019-10-11 ENCOUNTER — Ambulatory Visit (HOSPITAL_COMMUNITY): Payer: 59

## 2019-10-11 DIAGNOSIS — Z87891 Personal history of nicotine dependence: Secondary | ICD-10-CM

## 2019-10-11 DIAGNOSIS — E11319 Type 2 diabetes mellitus with unspecified diabetic retinopathy without macular edema: Secondary | ICD-10-CM | POA: Diagnosis not present

## 2019-10-11 DIAGNOSIS — I10 Essential (primary) hypertension: Secondary | ICD-10-CM | POA: Diagnosis present

## 2019-10-11 DIAGNOSIS — U071 COVID-19: Secondary | ICD-10-CM | POA: Diagnosis not present

## 2019-10-11 DIAGNOSIS — R778 Other specified abnormalities of plasma proteins: Secondary | ICD-10-CM | POA: Diagnosis not present

## 2019-10-11 DIAGNOSIS — Z885 Allergy status to narcotic agent status: Secondary | ICD-10-CM

## 2019-10-11 DIAGNOSIS — J1282 Pneumonia due to coronavirus disease 2019: Secondary | ICD-10-CM

## 2019-10-11 DIAGNOSIS — E785 Hyperlipidemia, unspecified: Secondary | ICD-10-CM | POA: Diagnosis present

## 2019-10-11 DIAGNOSIS — Z794 Long term (current) use of insulin: Secondary | ICD-10-CM

## 2019-10-11 DIAGNOSIS — I77810 Thoracic aortic ectasia: Secondary | ICD-10-CM | POA: Diagnosis present

## 2019-10-11 DIAGNOSIS — F40240 Claustrophobia: Secondary | ICD-10-CM | POA: Diagnosis present

## 2019-10-11 DIAGNOSIS — E7800 Pure hypercholesterolemia, unspecified: Secondary | ICD-10-CM | POA: Diagnosis present

## 2019-10-11 DIAGNOSIS — F439 Reaction to severe stress, unspecified: Secondary | ICD-10-CM | POA: Diagnosis present

## 2019-10-11 DIAGNOSIS — R0789 Other chest pain: Secondary | ICD-10-CM | POA: Diagnosis not present

## 2019-10-11 DIAGNOSIS — Z809 Family history of malignant neoplasm, unspecified: Secondary | ICD-10-CM

## 2019-10-11 DIAGNOSIS — I214 Non-ST elevation (NSTEMI) myocardial infarction: Secondary | ICD-10-CM | POA: Diagnosis present

## 2019-10-11 DIAGNOSIS — Z888 Allergy status to other drugs, medicaments and biological substances status: Secondary | ICD-10-CM

## 2019-10-11 DIAGNOSIS — Z6834 Body mass index (BMI) 34.0-34.9, adult: Secondary | ICD-10-CM

## 2019-10-11 DIAGNOSIS — E669 Obesity, unspecified: Secondary | ICD-10-CM | POA: Diagnosis present

## 2019-10-11 DIAGNOSIS — R000 Tachycardia, unspecified: Secondary | ICD-10-CM | POA: Diagnosis present

## 2019-10-11 DIAGNOSIS — Z79899 Other long term (current) drug therapy: Secondary | ICD-10-CM

## 2019-10-11 DIAGNOSIS — R002 Palpitations: Secondary | ICD-10-CM

## 2019-10-11 DIAGNOSIS — E876 Hypokalemia: Secondary | ICD-10-CM | POA: Diagnosis present

## 2019-10-11 DIAGNOSIS — R7989 Other specified abnormal findings of blood chemistry: Secondary | ICD-10-CM | POA: Diagnosis present

## 2019-10-11 DIAGNOSIS — G43909 Migraine, unspecified, not intractable, without status migrainosus: Secondary | ICD-10-CM | POA: Diagnosis present

## 2019-10-11 DIAGNOSIS — Z882 Allergy status to sulfonamides status: Secondary | ICD-10-CM

## 2019-10-11 DIAGNOSIS — Z8249 Family history of ischemic heart disease and other diseases of the circulatory system: Secondary | ICD-10-CM

## 2019-10-11 DIAGNOSIS — I248 Other forms of acute ischemic heart disease: Secondary | ICD-10-CM | POA: Diagnosis present

## 2019-10-11 LAB — CBC WITH DIFFERENTIAL/PLATELET
Abs Immature Granulocytes: 0.05 10*3/uL (ref 0.00–0.07)
Basophils Absolute: 0 10*3/uL (ref 0.0–0.1)
Basophils Relative: 0 %
Eosinophils Absolute: 0 10*3/uL (ref 0.0–0.5)
Eosinophils Relative: 0 %
HCT: 44.4 % (ref 36.0–46.0)
Hemoglobin: 14.4 g/dL (ref 12.0–15.0)
Immature Granulocytes: 1 %
Lymphocytes Relative: 13 %
Lymphs Abs: 1.1 10*3/uL (ref 0.7–4.0)
MCH: 26.8 pg (ref 26.0–34.0)
MCHC: 32.4 g/dL (ref 30.0–36.0)
MCV: 82.5 fL (ref 80.0–100.0)
Monocytes Absolute: 1.2 10*3/uL — ABNORMAL HIGH (ref 0.1–1.0)
Monocytes Relative: 15 %
Neutro Abs: 6 10*3/uL (ref 1.7–7.7)
Neutrophils Relative %: 71 %
Platelets: 303 10*3/uL (ref 150–400)
RBC: 5.38 MIL/uL — ABNORMAL HIGH (ref 3.87–5.11)
RDW: 12.9 % (ref 11.5–15.5)
WBC: 8.3 10*3/uL (ref 4.0–10.5)
nRBC: 0 % (ref 0.0–0.2)

## 2019-10-11 LAB — BRAIN NATRIURETIC PEPTIDE: B Natriuretic Peptide: 136.7 pg/mL — ABNORMAL HIGH (ref 0.0–100.0)

## 2019-10-11 LAB — BASIC METABOLIC PANEL
Anion gap: 16 — ABNORMAL HIGH (ref 5–15)
BUN: 14 mg/dL (ref 6–20)
CO2: 19 mmol/L — ABNORMAL LOW (ref 22–32)
Calcium: 9.3 mg/dL (ref 8.9–10.3)
Chloride: 102 mmol/L (ref 98–111)
Creatinine, Ser: 0.76 mg/dL (ref 0.44–1.00)
GFR calc Af Amer: >60 mL/min (ref >60)
GFR calc non Af Amer: >60 mL/min (ref >60)
Glucose, Bld: 299 mg/dL — ABNORMAL HIGH (ref 70–99)
Potassium: 3.7 mmol/L (ref 3.5–5.1)
Sodium: 137 mmol/L (ref 135–145)

## 2019-10-11 LAB — TROPONIN I (HIGH SENSITIVITY)
Troponin I (High Sensitivity): 61 ng/L — ABNORMAL HIGH (ref <18)
Troponin I (High Sensitivity): 180 ng/L (ref <18)
Troponin I (High Sensitivity): 251 ng/L (ref <18)

## 2019-10-11 LAB — HEPATIC FUNCTION PANEL
ALT: 22 U/L (ref 0–44)
AST: 21 U/L (ref 15–41)
Albumin: 4 g/dL (ref 3.5–5.0)
Alkaline Phosphatase: 70 U/L (ref 38–126)
Bilirubin, Direct: 0.1 mg/dL (ref 0.0–0.2)
Indirect Bilirubin: 0.6 mg/dL (ref 0.3–0.9)
Total Bilirubin: 0.7 mg/dL (ref 0.3–1.2)
Total Protein: 7.8 g/dL (ref 6.5–8.1)

## 2019-10-11 LAB — CBC
HCT: 46.6 % — ABNORMAL HIGH (ref 36.0–46.0)
Hemoglobin: 15.2 g/dL — ABNORMAL HIGH (ref 12.0–15.0)
MCH: 27.1 pg (ref 26.0–34.0)
MCHC: 32.6 g/dL (ref 30.0–36.0)
MCV: 83.1 fL (ref 80.0–100.0)
Platelets: 263 10*3/uL (ref 150–400)
RBC: 5.61 MIL/uL — ABNORMAL HIGH (ref 3.87–5.11)
RDW: 12.7 % (ref 11.5–15.5)
WBC: 9.7 10*3/uL (ref 4.0–10.5)
nRBC: 0 % (ref 0.0–0.2)

## 2019-10-11 LAB — D-DIMER, QUANTITATIVE: D-Dimer, Quant: <0.27 ug/mL-FEU (ref 0.00–0.50)

## 2019-10-11 LAB — LACTATE DEHYDROGENASE: LDH: 110 U/L (ref 98–192)

## 2019-10-11 LAB — FIBRINOGEN: Fibrinogen: 442 mg/dL (ref 210–475)

## 2019-10-11 LAB — FERRITIN: Ferritin: 231 ng/mL (ref 11–307)

## 2019-10-11 LAB — CBG MONITORING, ED
Glucose-Capillary: 269 mg/dL — ABNORMAL HIGH (ref 70–99)
Glucose-Capillary: 190 mg/dL — ABNORMAL HIGH (ref 70–99)

## 2019-10-11 LAB — PROCALCITONIN: Procalcitonin: <0.1 ng/mL

## 2019-10-11 LAB — MAGNESIUM: Magnesium: 1.8 mg/dL (ref 1.7–2.4)

## 2019-10-11 LAB — C-REACTIVE PROTEIN: CRP: 1.8 mg/dL — ABNORMAL HIGH (ref <1.0)

## 2019-10-11 MED ORDER — LACTATED RINGERS IV BOLUS
500.0000 mL | Freq: Once | INTRAVENOUS | Status: AC
  Administered 2019-10-11: 500 mL via INTRAVENOUS

## 2019-10-11 MED ORDER — SODIUM CHLORIDE 0.9 % IV SOLN
200.0000 mg | Freq: Once | INTRAVENOUS | Status: AC
  Administered 2019-10-12: 200 mg via INTRAVENOUS
  Filled 2019-10-11: qty 40

## 2019-10-11 MED ORDER — IOHEXOL 350 MG/ML SOLN
100.0000 mL | Freq: Once | INTRAVENOUS | Status: AC | PRN
  Administered 2019-10-11: 100 mL via INTRAVENOUS

## 2019-10-11 MED ORDER — HEPARIN (PORCINE) 25000 UT/250ML-% IV SOLN: 1050.0000 [IU]/h | INTRAVENOUS | Status: DC

## 2019-10-11 MED ORDER — EPINEPHRINE 0.3 MG/0.3ML IJ SOAJ
0.3000 mg | Freq: Once | INTRAMUSCULAR | Status: DC | PRN
  Filled 2019-10-11: qty 0.6

## 2019-10-11 MED ORDER — ASPIRIN EC 81 MG PO TBEC
81.0000 mg | DELAYED_RELEASE_TABLET | Freq: Every day | ORAL | Status: DC
  Administered 2019-10-11 – 2019-10-13 (×3): 81 mg via ORAL
  Filled 2019-10-11 (×3): qty 1

## 2019-10-11 MED ORDER — INSULIN ASPART 100 UNIT/ML ~~LOC~~ SOLN
0.0000 [IU] | SUBCUTANEOUS | Status: DC
  Administered 2019-10-11 – 2019-10-12 (×4): 2 [IU] via SUBCUTANEOUS
  Administered 2019-10-12 (×2): 3 [IU] via SUBCUTANEOUS
  Administered 2019-10-12 (×2): 1 [IU] via SUBCUTANEOUS
  Administered 2019-10-13: 3 [IU] via SUBCUTANEOUS
  Administered 2019-10-13: 1 [IU] via SUBCUTANEOUS

## 2019-10-11 MED ORDER — LORAZEPAM 1 MG PO TABS
1.0000 mg | ORAL_TABLET | Freq: Once | ORAL | Status: AC
  Administered 2019-10-11: 1 mg via ORAL
  Filled 2019-10-11: qty 1

## 2019-10-11 MED ORDER — SODIUM CHLORIDE 0.9 % IV SOLN
1200.0000 mg | Freq: Once | INTRAVENOUS | Status: AC
  Administered 2019-10-11: 1200 mg via INTRAVENOUS
  Filled 2019-10-11 (×2): qty 10

## 2019-10-11 MED ORDER — HEPARIN (PORCINE) 25000 UT/250ML-% IV SOLN
1650.0000 [IU]/h | INTRAVENOUS | Status: DC
  Administered 2019-10-11: 1050 [IU]/h via INTRAVENOUS
  Administered 2019-10-13: 1650 [IU]/h via INTRAVENOUS
  Filled 2019-10-11 (×3): qty 250

## 2019-10-11 MED ORDER — METHYLPREDNISOLONE SODIUM SUCC 125 MG IJ SOLR: 125.0000 mg | Freq: Once | INTRAMUSCULAR | Status: DC | PRN

## 2019-10-11 MED ORDER — DIPHENHYDRAMINE HCL 50 MG/ML IJ SOLN: 50.0000 mg | Freq: Once | INTRAMUSCULAR | Status: DC | PRN

## 2019-10-11 MED ORDER — SODIUM CHLORIDE 0.9 % IV SOLN: INTRAVENOUS | Status: DC | PRN

## 2019-10-11 MED ORDER — ALBUTEROL SULFATE HFA 108 (90 BASE) MCG/ACT IN AERS: 2.0000 | INHALATION_SPRAY | Freq: Once | RESPIRATORY_TRACT | Status: DC | PRN

## 2019-10-11 MED ORDER — SODIUM CHLORIDE 0.9 % IV SOLN
100.0000 mg | Freq: Every day | INTRAVENOUS | Status: DC
  Administered 2019-10-12 – 2019-10-13 (×2): 100 mg via INTRAVENOUS
  Filled 2019-10-11: qty 100
  Filled 2019-10-11: qty 20

## 2019-10-11 MED ORDER — SODIUM CHLORIDE 0.9 % IV SOLN: 100.0000 mg | Freq: Every day | INTRAVENOUS | Status: DC

## 2019-10-11 MED ORDER — HEPARIN BOLUS VIA INFUSION
4000.0000 [IU] | Freq: Once | INTRAVENOUS | Status: AC
  Administered 2019-10-11: 4000 [IU] via INTRAVENOUS
  Filled 2019-10-11: qty 4000

## 2019-10-11 MED ORDER — FAMOTIDINE IN NACL 20-0.9 MG/50ML-% IV SOLN
20.0000 mg | Freq: Once | INTRAVENOUS | Status: DC | PRN
  Filled 2019-10-11: qty 50

## 2019-10-11 MED ORDER — ASPIRIN 81 MG PO CHEW
324.0000 mg | CHEWABLE_TABLET | Freq: Once | ORAL | Status: AC
  Administered 2019-10-11: 324 mg via ORAL
  Filled 2019-10-11: qty 4

## 2019-10-11 MED ORDER — SODIUM CHLORIDE 0.9 % IV SOLN: 200.0000 mg | Freq: Once | INTRAVENOUS | Status: DC

## 2019-10-11 MED ORDER — INSULIN DETEMIR 100 UNIT/ML ~~LOC~~ SOLN
0.0750 [IU]/kg | Freq: Two times a day (BID) | SUBCUTANEOUS | Status: DC
  Administered 2019-10-13: 7 [IU] via SUBCUTANEOUS
  Filled 2019-10-11 (×5): qty 0.07

## 2019-10-11 MED ORDER — LINAGLIPTIN 5 MG PO TABS
5.0000 mg | ORAL_TABLET | Freq: Every day | ORAL | Status: DC
  Administered 2019-10-13: 5 mg via ORAL
  Filled 2019-10-11 (×4): qty 1

## 2019-10-11 NOTE — ED Provider Notes (Signed)
[facility] EMERGENCY DEPARTMENT Provider Note   CSN: [id number] Arrival date & time: 10/11/19  1516     History Chief Complaint  Patient presents with   Chest Pain    covid pos / recent loss of loved one    [name] is a 54 y.o. female past medical history of hypertension, hyperlipidemia, obesity, insulin-dependent diabetes, recently diagnosed COVID-17 with symptoms for approximately 9 days presents the ED today originally with her husband who presented in cardiac arrest, after his death was pronounced she reportedly stood up and felt sudden heavy chest pressure, dizziness and shortness of breath which quickly resolved.  She states the chest pain was nonradiating was not associated with diaphoresis or nausea.  Denies history of the same, no coronary history specifically.  States that she just wants to make sure her heart is okay so she can go home safely and grieve.  Notably the patient was on the way to a monoclonal antibody infusion appointment when her husband went into cardiac arrest.  The history is provided by the patient.  Illness Quality:  Substernal Severity:  Moderate Onset quality:  Gradual Duration:  15 minutes Timing:  Constant Progression:  Improving Chronicity:  New Context:  Stressful event Associated symptoms: chest pain, fatigue, fever and shortness of breath   Associated symptoms: no abdominal pain, no cough, no headaches, no rash and no vomiting     HPI: A 54 year old patient with a history of treated diabetes, hypertension, hypercholesterolemia and obesity presents for evaluation of chest pain. Initial onset of pain was less than one hour ago. The patient's chest pain is described as heaviness/pressure/tightness and is not worse with exertion. The patient's chest pain is middle- or left-sided, is not well-localized, is not sharp and does not radiate to the arms/jaw/neck. The patient does not complain of nausea and denies diaphoresis. The  patient has a family history of coronary artery disease in a first-degree relative with onset less than age 44. The patient has no history of stroke, has no history of peripheral artery disease and has not smoked in the past 90 days.   Past Medical History:  Diagnosis Date   Claustrophobia    Dental crown present    lower right   Diabetic retinopathy ([place])    OS   Hyperlipidemia    Hypertension    states under control with meds., has been on med. x 26 yrs.; states blood pressure goes up when she gets nervous   Insulin dependent diabetes mellitus    Migraines    Obesity    Palpitations    Trigger thumb of right hand 09/2016    Patient Active Problem List   Diagnosis Date Noted   Elevated troponin 10/11/2019   Obesity (BMI 30-39.9) 08/08/2017   Controlled type 2 diabetes mellitus with retinopathy of left eye, with long-term current use of insulin ([place]) 06/18/2017   Hyperlipidemia 06/18/2017   Chronic migraine 11/08/2015   Morbid obesity ([place]) 11/08/2015    Past Surgical History:  Procedure Laterality Date   ABDOMINAL HYSTERECTOMY     partial   CARDIAC CATHETERIZATION  05/27/2007   normal coronary arteries   CARPAL TUNNEL RELEASE Bilateral    CHOLECYSTECTOMY  1991   DE QUERVAIN'S RELEASE Right    GANGLION CYST EXCISION Left    wrist   INGUINAL HERNIA REPAIR Right    SOFT TISSUE MASS EXCISION Right 09/04/2009   gluteus   TRIGGER FINGER RELEASE Right 10/03/2016   Procedure: RELEASE TRIGGER FINGER/A-1  PULLEY RIGHT THUMB;  Surgeon: Leanora Cover, MD;  Location: [facility];  Service: Orthopedics;  Laterality: Right;   UTERINE SUSPENSION       OB History   No obstetric history on file.     Family History  Problem Relation Age of Onset   Cancer Other    Hypertension Other    Hyperlipidemia Other    Heart attack Other    Cancer Mother        Colorectal   Heart disease Father    Heart attack Father    Cancer Sister         Colorectal   Cancer Brother        Colorectal   Heart disease Brother    Heart attack Brother    Heart disease Paternal Grandmother    Heart disease Paternal Grandfather     Social History   Tobacco Use   Smoking status: Former Smoker    Quit date: 02/03/1997    Years since quitting: 22.6   Smokeless tobacco: Never Used  Vaping Use   Vaping Use: Never used  Substance Use Topics   Alcohol use: Yes    Comment: occasionally   Drug use: No    Home Medications Prior to Admission medications   Medication Sig Start Date End Date Taking? Authorizing Provider  amLODipine (NORVASC) 5 MG tablet Take 5 mg by mouth daily.    [provider]  empagliflozin (JARDIANCE) 25 MG TABS tablet TAKE 25 MG BY MOUTH DAILY. 06/25/19   Philemon Kingdom, MD  fluticasone (FLONASE) 50 MCG/ACT nasal spray Place 1 spray into both nostrils daily. 12/03/18   [provider]  hydrochlorothiazide (HYDRODIURIL) 12.5 MG tablet Take 12.5 mg by mouth daily. 08/01/18   [provider]  meclizine (ANTIVERT) 25 MG tablet Take 25 mg by mouth 3 (three) times daily as needed for dizziness.    [provider]  metFORMIN (GLUCOPHAGE) 1000 MG tablet Take 1 tablet (1,000 mg total) by mouth 2 (two) times daily with a meal. 06/25/19   Philemon Kingdom, MD  metoprolol (LOPRESSOR) 50 MG tablet daily. 10/28/15   [provider]  simvastatin (ZOCOR) 20 MG tablet Take 20 mg by mouth daily.    [provider]  SUMAtriptan (IMITREX) 50 MG tablet Take 1 tablet (50 mg total) by mouth every 2 (two) hours as needed for migraine. May repeat in 2 hours if headache persists or recurs. 01/10/16   Marcial Pacas, MD  valsartan-hydrochlorothiazide (DIOVAN-HCT) 320-12.5 MG tablet Take 1 tablet by mouth daily.    [provider]    Allergies    Azo [phenazopyridine], Morphine and related, Opium, and Sulfa antibiotics  Review of Systems   Review of Systems  Constitutional:  Positive for chills, fatigue and fever.  HENT: Negative for facial swelling and voice change.   Eyes: Negative for redness and visual disturbance.  Respiratory: Positive for shortness of breath. Negative for cough.   Cardiovascular: Positive for chest pain and palpitations.  Gastrointestinal: Negative for abdominal pain and vomiting.  Genitourinary: Negative for difficulty urinating and dysuria.  Musculoskeletal: Negative for gait problem and joint swelling.  Skin: Negative for rash and wound.  Neurological: Positive for weakness. Negative for dizziness and headaches.  Psychiatric/Behavioral: Negative for confusion and suicidal ideas.    Physical Exam Updated Vital Signs BP (!) 145/75    Pulse 96    Temp 100.2 F (37.9 C)    Resp (!) 22    Ht 5\' 7"  (  1.702 m)    Wt 99.8 kg    SpO2 99%    BMI 34.46 kg/m   Physical Exam Constitutional:      General: She is not in acute distress.    Appearance: She is not ill-appearing.  HENT:     Head: Normocephalic and atraumatic.     Mouth/Throat:     Mouth: Mucous membranes are moist.     Pharynx: Oropharynx is clear.  Eyes:     General: No scleral icterus.    Pupils: Pupils are equal, round, and reactive to light.  Cardiovascular:     Rate and Rhythm: Regular rhythm. Tachycardia present.     Pulses: Normal pulses.          Radial pulses are 2+ on the right side and 2+ on the left side.       Dorsalis pedis pulses are 2+ on the right side and 2+ on the left side.       Posterior tibial pulses are 2+ on the right side and 2+ on the left side.  Pulmonary:     Effort: Pulmonary effort is normal. No respiratory distress.  Abdominal:     General: There is no distension.     Tenderness: There is no abdominal tenderness.  Musculoskeletal:        General: No tenderness or deformity.     Cervical back: Normal range of motion and neck supple.  Neurological:     General: No focal deficit present.     Mental Status: She is alert and oriented to  person, place, and time.  Psychiatric:        Mood and Affect: Mood normal.        Behavior: Behavior normal.     ED Results / Procedures / Treatments   Labs (all labs ordered are listed, but only abnormal results are displayed) Labs Reviewed  BASIC METABOLIC PANEL - Abnormal; Notable for the following components:      Result Value   CO2 19 (*)    Glucose, Bld 299 (*)    Anion gap 16 (*)    All other components within normal limits  CBC - Abnormal; Notable for the following components:   RBC 5.61 (*)    Hemoglobin 15.2 (*)    HCT 46.6 (*)    All other components within normal limits  BRAIN NATRIURETIC PEPTIDE - Abnormal; Notable for the following components:   B Natriuretic Peptide 136.7 (*)    All other components within normal limits  CBG MONITORING, ED - Abnormal; Notable for the following components:   Glucose-Capillary 269 (*)    All other components within normal limits  TROPONIN I (HIGH SENSITIVITY) - Abnormal; Notable for the following components:   Troponin I (High Sensitivity) 61 (*)    All other components within normal limits  TROPONIN I (HIGH SENSITIVITY) - Abnormal; Notable for the following components:   Troponin I (High Sensitivity) 180 (*)    All other components within normal limits  MAGNESIUM  HEPATIC FUNCTION PANEL    EKG EKG Interpretation  Date/Time:  Monday October 11 2019 15:17:36 EDT Ventricular Rate:  107 PR Interval:    QRS Duration: 85 QT Interval:  346 QTC Calculation: 462 R Axis:   158 Text Interpretation: Sinus tachycardia Anterolateral infarct, old No significant change since last tracing Confirmed by Theotis Burrow [id number]) on 10/11/2019 3:55:33 PM   Radiology CT Angio Chest PE W and/or Wo Contrast  Result Date: 10/11/2019 CLINICAL DATA:  PE  suspected, high prob Acute onset of chest pain while mourning husband's passing, has been with COVID positive. EXAM: CT ANGIOGRAPHY CHEST WITH CONTRAST TECHNIQUE: Multidetector CT imaging of the  chest was performed using the standard protocol during bolus administration of intravenous contrast. Multiplanar CT image reconstructions and MIPs were obtained to evaluate the vascular anatomy. CONTRAST:  151mL OMNIPAQUE IOHEXOL 350 MG/ML SOLN Initial contrast bolus was suboptimal, repeat injection with improved pulmonary artery opacification. COMPARISON:  Radiograph earlier today. FINDINGS: Cardiovascular: There are no filling defects within the pulmonary arteries to suggest pulmonary embolus. No aortic dissection. Mild aortic atherosclerosis. Ascending aortic ectasia at 4 cm, measured on series 9, image 62. Upper normal heart size. There are coronary artery calcifications. No pericardial effusion. Mediastinum/Nodes: Enlarged 15 mm right hilar node, likely reactive. Small mediastinal lymph nodes are not enlarged by size criteria. Esophagus slightly patulous. There is no esophageal wall thickening. Lungs/Pleura: Heterogeneous pulmonary parenchyma with geographic areas of interspersed ground-glass opacity. Mild central bronchial thickening. No pleural fluid. Trachea and central bronchi are patent. Upper Abdomen: No acute findings.  Cholecystectomy. Musculoskeletal: There are no acute or suspicious osseous abnormalities. Review of the MIP images confirms the above findings. IMPRESSION: 1. No pulmonary embolus. 2. Heterogeneous pulmonary parenchyma with geographic areas of interspersed ground-glass opacity. Findings likely represent a combination of small airways disease and COVID-19 pneumonia. 3. Mild central bronchial thickening. 4. Ectatic ascending aorta at 4 cm. Recommend annual imaging followup by CTA or MRA. This recommendation follows 2010 ACCF/AHA/AATS/ACR/ASA/SCA/SCAI/SIR/STS/SVM Guidelines for the Diagnosis and Management of Patients with Thoracic Aortic Disease. Circulation. 2010; 121: U132-G401. Aortic aneurysm NOS (ICD10-I71.9) Aortic Atherosclerosis (ICD10-I70.0). Electronically Signed   By: Keith Rake M.D.   On: 10/11/2019 18:08   DG Chest Portable 1 View  Result Date: 10/11/2019 CLINICAL DATA:  Dyspnea EXAM: PORTABLE CHEST 1 VIEW COMPARISON:  10/08/2019 FINDINGS: The heart size and mediastinal contours are within normal limits. Both lungs are clear. The visualized skeletal structures are unremarkable. IMPRESSION: No active disease. Electronically Signed   By: Fidela Salisbury MD   On: 10/11/2019 16:13    Procedures Procedures (including critical care time)  Medications Ordered in ED Medications  0.9 %  sodium chloride infusion (has no administration in time range)  diphenhydrAMINE (BENADRYL) injection 50 mg (has no administration in time range)  famotidine (PEPCID) IVPB 20 mg premix (has no administration in time range)  methylPREDNISolone sodium succinate (SOLU-MEDROL) 125 mg/2 mL injection 125 mg (has no administration in time range)  albuterol (VENTOLIN HFA) 108 (90 Base) MCG/ACT inhaler 2 puff (has no administration in time range)  EPINEPHrine (EPI-PEN) injection 0.3 mg (has no administration in time range)  heparin bolus via infusion 4,000 Units (has no administration in time range)  heparin ADULT infusion 100 units/mL (25000 units/210mL sodium chloride 0.45%) (has no administration in time range)  aspirin chewable tablet 324 mg (324 mg Oral Given 10/11/19 1548)  casirivimab-imdevimab (REGEN-COV) 1,200 mg in sodium chloride 0.9 % 110 mL IVPB (0 mg Intravenous Stopped 10/11/19 1629)  LORazepam (ATIVAN) tablet 1 mg (1 mg Oral Given 10/11/19 1628)  lactated ringers bolus 500 mL (500 mLs Intravenous New Bag/Given 10/11/19 1935)  iohexol (OMNIPAQUE) 350 MG/ML injection 100 mL (100 mLs Intravenous Contrast Given 10/11/19 1747)    ED Course  I have reviewed the triage vital signs and the nursing notes.  Pertinent labs & imaging results that were available during my care of the patient were reviewed by me and considered in my medical decision making (see  chart for details).    MDM  Rules/Calculators/A&P HEAR Score: 4                       EKG findings by my read: Compared to prior: 10/11/2015.  Rate: 107 rhythm: sinus Axis: Right shift PR: Within normal limits QRS: 85 QTc: 462.  No evidence of ischemia or arrhythmia, nor any other pathologic findings concerning considering patient presentation. Findings discussed with attending who agrees.  Differential diagnosis considered:  ACS, PE, anxiety attack, CHF, COVID-19, pneumonia, pneumothorax  Patient presents to the ED with sudden onset of chest pain soon after being told that her husband had died.  This began relatively suddenly and is resolving relatively quickly associate with some tachycardia and a sensation of anxiety, I suspect the patient's presentation is likely related to the anxiety of having been recently informed of her husband's death, given her concern and risk factors, will attempt to exclude ACS.  Given her presentation and the fact that she is symptomatically improving, have a very low suspicion for PE and do not feel that this requires further work-up at this time.  EKG as above reassuring from an ischemic standpoint.  Hear score calculated at 4, moderate risk  Patient quite hypertensive although this is in the setting of pretty significant emotional distress, she states she did take her antihypertensives this morning, will tolerate slight elevations, will administer a little Ativan for now along with a little bit of fluids given that CBC seems hemoconcentrated.  Troponin elevated at 61 which is surprising could be due to to her acute emotional distress and hypertension although raises concern for possible PE, will obtain CT PE study.  PE study negative.  Given her positive troponin, I feel the patient would benefit from admission inpatient for NSTEMI.  Hospital medicine patient admission, who agreed with the plan.  Repeat troponin elevated at 180, cardiology consulted and patient presented to the fellow who  feels that the patient would benefit from heparin, no acute intervention at this time otherwise.  Patient notified of these findings and the plan for admission, she expressed understanding and agreement.  Final Clinical Impression(s) / ED Diagnoses Final diagnoses:  NSTEMI (non-ST elevated myocardial infarction) ([place])  Atypical chest pain    Rx / DC Orders ED Discharge Orders    None     Labs, studies and imaging reviewed by myself and considered in medical decision making if ordered. Imaging interpreted by radiology. Pt was discussed with my attending, Dr. Rex Kras  Electronically signed by:  Roderic Palau [place]9/6/20218:44 PM       Renold Genta, MD 10/11/19 2044    Rex Kras Wenda [place], MD 10/11/19 2317

## 2019-10-11 NOTE — Progress Notes (Signed)
ANTICOAGULATION CONSULT NOTE - Initial Consult  Pharmacy Consult for heparin  Indication: chest pain/ACS  Allergies  Allergen Reactions  . Azo [Phenazopyridine] Hives and Swelling    FACIAL SWELLING, CHEST TIGHTNESS  . Morphine And Related Nausea And Vomiting and Other (See Comments)    DIZZINESS  . Opium Itching and Nausea And Vomiting    OPIODS/NARCOTICS - ITCHING OF NOSE, DIZZINESS  . Sulfa Antibiotics Rash    Patient Measurements: Height: 5\' 7"  (170.2 cm) Weight: 99.8 kg (220 lb) IBW/kg (Calculated) : 61.6 Heparin Dosing Weight: 83.8 kg   Vital Signs: Temp: 100.2 F (37.9 C) (09/06 1521) BP: 145/75 (09/06 1800) Pulse Rate: 96 (09/06 1800)  Labs: Recent Labs    10/11/19 1532 10/11/19 1847  HGB 15.2*  --   HCT 46.6*  --   PLT 263  --   CREATININE 0.76  --   TROPONINIHS 61* 180*    Estimated Creatinine Clearance: 96.5 mL/min (by C-G formula based on SCr of 0.76 mg/dL).   Medical History: Past Medical History:  Diagnosis Date  . Claustrophobia   . Dental crown present    lower right  . Diabetic retinopathy ([facility])    OS  . Hyperlipidemia   . Hypertension    states under control with meds., has been on med. x 26 yrs.; states blood pressure goes up when she gets nervous  . Insulin dependent diabetes mellitus   . Migraines   . Obesity   . Palpitations   . Trigger thumb of right hand 09/2016    Medications:  (Not in a hospital admission)   Assessment: 40 YOF with chest pain and rising troponins to start IV heparin for ACS. H/H high. Plt wnl. SCr wnl   Goal of Therapy:  Heparin level 0.3-0.7 units/ml Monitor platelets by anticoagulation protocol: Yes   Plan:  -Heparin 4000 units IV bolus then start infusion at 1050 units/hr  -F/u 6 hr HL -Monitor daily HL, CBC and s/s of bleeding   Albertina Parr, PharmD., BCPS, BCCCP Clinical Pharmacist Clinical phone for 10/11/19 until 3:30pm: [phone] If after 3:30pm, please refer to [facility] for  unit-specific pharmacist

## 2019-10-11 NOTE — ED Notes (Signed)
Before going to CT, Pt reports feeling fine and having no type of reaction to medication, Pt remains on the monitor x 3, call bell within reach

## 2019-10-11 NOTE — ED Triage Notes (Signed)
PT was at husband's bedside mourning his passing in rm 23, when she began having chest pain.  When placed on monitor, hr was 110.  However, pt began feeling as if she was going to feint and hr increased to 188.

## 2019-10-11 NOTE — H&P (Addendum)
[name] LGX:[id number] DOB: [birth year] DOA: October 18, 2019     PCP: Leeanne Rio, MD   Outpatient Specialists:  NONE    Patient arrived to ER on 10-18-2019 at 1516 Referred by Attending Little, Wenda Overland, MD   Patient coming from: home Lives With family (husband just passed away from [place] on 10/18/2019)   Chief Complaint:   Chief Complaint  Patient presents with  . Chest Pain    covid pos / recent loss of loved one    HPI: [name] is a 54 y.o. female with medical history significant of DM2,  HTN , HLD  recently diagnosed with COVID 6 days ago   Presented with recent loss of the loved one. Both patient and her Husband tesed Positive For COVID. Patient's husband  Had a cardiac arrest on his way to get monoclonal antibodies. He had prolonged code but eventually he could not  be resuscitated and unfortunately was pronounced dead in ER Room. While in the room with her deceased husband patient started to have chest pain. She became tachycardic and hypertensive This though to be stress reaction but given strong family hx of  CAD and personal Hx of DM  Troponin was obtained and noted to be up to 60 repeat up  to 180  cardiology was consulted  Patient reports his symptoms started about a week after her husband. She believes they initially became infected when they were exposed to his sister tested positive file days later after the exposure she is currently hospitalized as well   infectious risk factors:  Reports shortness of breath, dry cough, chest pain,  KNOWN COVID POSITIVE   Has   NOt been vaccinated against COVID ( was planning to get vaccinated)    Initial COVID TEST   *in house  PCR testing  Pending  No results found for: SARSCOV2NAA   Regarding pertinent Chronic problems:    Hyperlipidemia -  on statins Zocor Lipid Panel     Component Value Date/Time   CHOL  05/27/2007 0425    186        ATP III CLASSIFICATION:  <200     mg/dL   Desirable  200-239   mg/dL   Borderline High  >=240    mg/dL   High   TRIG 363 (H) 05/27/2007 0425   HDL 23 (L) 05/27/2007 0425   CHOLHDL 8.1 05/27/2007 0425   VLDL 73 (H) 05/27/2007 0425   LDLCALC  05/27/2007 0425    90        Total Cholesterol/HDL:CHD Risk Coronary Heart Disease Risk Table                     Men   Women  1/2 Average Risk   3.4   3.3     HTN on NORVASC, HCTZ, Lopressor, Diaovan/HCTz      DM 2 -  Lab Results  Component Value Date   HGBA1C 7.1 (A) 08/08/2017   on insulin SSI    obesity-   BMI Readings from Last 1 Encounters:  10-18-19 34.46 kg/m     While in ER: She got monoclonal AB infusion in ER Chest pain resolved Sec trop up to 180 Cardiology consulted and recommended heparin initiation  ER Provider Called: Cardiology   Dr.Pemberton  They Recommend admit to medicine   Will see in AM    Hospitalist was called for admission for NSTEMI in the [facility]  The following Work  up has been ordered so far:  Orders Placed This Encounter  Procedures  . DG Chest Portable 1 View  . CT Angio Chest PE W and/or Wo Contrast  . Basic metabolic panel  . CBC  . Brain natriuretic peptide (order if patient c/o SOB ONLY)  . Magnesium  . Hepatic function panel  . Cardiac monitoring  . Initiate Carrier Fluid Protocol  . Hypersensitivity GRADE 1: Transient flushing or rash, or drug fever < 100.4 F  . Hypersensitivity GRADE 2: Rash, flushing, urticaria, dyspnea, or drug fever = or > 100.4 F  . Hypersensitivity GRADE 3: symptomatic bronchospasm, with or without urticaria, parenteral medication management indicated, allergy-related edema/angioedema, or hypotension  . Hypersensitivity GRADE 4: Anaphylaxis  . Complete patient signature process for consent form  . casirivimab / imdevimab per pharmacy consult  . Consult for [place] Admission  ALL PATIENTS BEING ADMITTED/HAVING PROCEDURES NEED COVID-19 SCREENING  . Pulse oximetry, continuous  . CBG monitoring, ED  .  EKG 12-Lead  . ED EKG  . Insert peripheral IV    Following Medications were ordered in ER: Medications  0.9 %  sodium chloride infusion (has no administration in time range)  diphenhydrAMINE (BENADRYL) injection 50 mg (has no administration in time range)  famotidine (PEPCID) IVPB 20 mg premix (has no administration in time range)  methylPREDNISolone sodium succinate (SOLU-MEDROL) 125 mg/2 mL injection 125 mg (has no administration in time range)  albuterol (VENTOLIN HFA) 108 (90 Base) MCG/ACT inhaler 2 puff (has no administration in time range)  EPINEPHrine (EPI-PEN) injection 0.3 mg (has no administration in time range)  aspirin chewable tablet 324 mg (324 mg Oral Given 10/11/19 1548)  casirivimab-imdevimab (REGEN-COV) 1,200 mg in sodium chloride 0.9 % 110 mL IVPB (0 mg Intravenous Stopped 10/11/19 1629)  LORazepam (ATIVAN) tablet 1 mg (1 mg Oral Given 10/11/19 1628)  lactated ringers bolus 500 mL (500 mLs Intravenous New Bag/Given 10/11/19 1935)  iohexol (OMNIPAQUE) 350 MG/ML injection 100 mL (100 mLs Intravenous Contrast Given 10/11/19 1747)     Significant initial  Findings: Abnormal Labs Reviewed  BASIC METABOLIC PANEL - Abnormal; Notable for the following components:      Result Value   CO2 19 (*)    Glucose, Bld 299 (*)    Anion gap 16 (*)    All other components within normal limits  CBC - Abnormal; Notable for the following components:   RBC 5.61 (*)    Hemoglobin 15.2 (*)    HCT 46.6 (*)    All other components within normal limits  BRAIN NATRIURETIC PEPTIDE - Abnormal; Notable for the following components:   B Natriuretic Peptide 136.7 (*)    All other components within normal limits  C-REACTIVE PROTEIN - Abnormal; Notable for the following components:   CRP 1.8 (*)    All other components within normal limits  CBC WITH DIFFERENTIAL/PLATELET - Abnormal; Notable for the following components:   RBC 5.38 (*)    Monocytes Absolute 1.2 (*)    All other components within normal  limits  CBG MONITORING, ED - Abnormal; Notable for the following components:   Glucose-Capillary 269 (*)    All other components within normal limits  CBG MONITORING, ED - Abnormal; Notable for the following components:   Glucose-Capillary 190 (*)    All other components within normal limits  TROPONIN I (HIGH SENSITIVITY) - Abnormal; Notable for the following components:   Troponin I (High Sensitivity) 61 (*)    All other components within normal  limits  TROPONIN I (HIGH SENSITIVITY) - Abnormal; Notable for the following components:   Troponin I (High Sensitivity) 180 (*)    All other components within normal limits  TROPONIN I (HIGH SENSITIVITY) - Abnormal; Notable for the following components:   Troponin I (High Sensitivity) 251 (*)    All other components within normal limits   Otherwise labs showing:    Recent Labs  Lab 10/11/19 1532  NA 137  K 3.7  CO2 19*  GLUCOSE 299*  BUN 14  CREATININE 0.76  CALCIUM 9.3  MG 1.8    Cr stable, Lab Results  Component Value Date   CREATININE 0.76 10/11/2019   CREATININE 0.61 10/01/2016   CREATININE 0.52 01/20/2010    Recent Labs  Lab 10/11/19 1532  AST 21  ALT 22  ALKPHOS 70  BILITOT 0.7  PROT 7.8  ALBUMIN 4.0   Lab Results  Component Value Date   CALCIUM 9.3 10/11/2019     WBC     Component Value Date/Time   WBC 9.7 10/11/2019 1532   ANC    Component Value Date/Time   NEUTROABS 10.1 (H) 01/21/2010 0720   ALC No components found for: LYMPHAB    Plt: Lab Results  Component Value Date   PLT 263 10/11/2019    Procalcitonin <0.1   COVID-19 Labs  Recent Labs    10/11/19 2219  DDIMER <0.27  FERRITIN 231  LDH 110  CRP 1.8*    No results found for: SARSCOV2NAA      HG/HCT  stable,      Component Value Date/Time   HGB 15.2 (H) 10/11/2019 1532   HCT 46.6 (H) 10/11/2019 1532     Troponin 61- 180- 250   ECG: Ordered Personally reviewed by me showing: HR : 107  Rhythm:    Sinus  tachycardia     no evidence of ischemic changes QTC 462   BNP (last 3 results) Recent Labs    10/11/19 1532  BNP 136.7*      DM  labs:  HbA1C: No results for input(s): HGBA1C in the last 8760 hours.     CBG (last 3)  Recent Labs    10/11/19 1556  GLUCAP 269*       UA  not ordered   Ordered   CXR -  NON acute  CTA chest - no PE,evidence of infiltrate = COVID PNA  Ectatic ascending aorta at 4 cm    ED Triage Vitals  Enc Vitals Group     BP 10/11/19 1545 (!) 177/103     Pulse Rate 10/11/19 1521 (!) 104     Resp 10/11/19 1521 (!) 21     Temp 10/11/19 1521 100.2 F (37.9 C)     Temp src --      SpO2 10/11/19 1521 99 %     Weight 10/11/19 1527 220 lb (99.8 kg)     Height 10/11/19 1527 5\' 7"  (1.702 m)     Head Circumference --      Peak Flow --      Pain Score 10/11/19 1527 4     Pain Loc --      Pain Edu? --      Excl. in [place]? --   TMAX(24)@       Latest  Blood pressure (!) 145/75, pulse 96, temperature 100.2 F (37.9 C), resp. rate (!) 22, height 5\' 7"  (1.702 m), weight 99.8 kg, SpO2 99 %.     Review of Systems:  Pertinent positives include:  chills, fatigue,  non-productive cough,   chest pain, Constitutional:  No weight loss, night sweats, Fevers,weight loss  HEENT:  No headaches, Difficulty swallowing,Tooth/dental problems,Sore throat,  No sneezing, itching, ear ache, nasal congestion, post nasal drip,  Cardio-vascular:  No Orthopnea, PND, anasarca, dizziness, palpitations.no Bilateral lower extremity swelling  GI:  No heartburn, indigestion, abdominal pain, nausea, vomiting, diarrhea, change in bowel habits, loss of appetite, melena, blood in stool, hematemesis Resp:  no shortness of breath at rest. No dyspnea on exertion, No excess mucus, no productive cough,  No coughing up of blood.No change in color of mucus.No wheezing. Skin:  no rash or lesions. No jaundice GU:  no dysuria, change in color of urine, no urgency or frequency. No straining to  urinate.  No flank pain.  Musculoskeletal:  No joint pain or no joint swelling. No decreased range of motion. No back pain.  Psych:  No change in mood or affect. No depression or anxiety. No memory loss.  Neuro: no localizing neurological complaints, no tingling, no weakness, no double vision, no gait abnormality, no slurred speech, no confusion  All systems reviewed and apart from [place] all are negative  Past Medical History:   Past Medical History:  Diagnosis Date  . Claustrophobia   . Dental crown present    lower right  . Diabetic retinopathy (HCC)    OS  . Hyperlipidemia   . Hypertension    states under control with meds., has been on med. x 26 yrs.; states blood pressure goes up when she gets nervous  . Insulin dependent diabetes mellitus   . Migraines   . Obesity   . Palpitations   . Trigger thumb of right hand 09/2016     Past Surgical History:  Procedure Laterality Date  . ABDOMINAL HYSTERECTOMY     partial  . CARDIAC CATHETERIZATION  05/27/2007   normal coronary arteries  . CARPAL TUNNEL RELEASE Bilateral   . CHOLECYSTECTOMY  1991  . DE QUERVAIN'S RELEASE Right   . GANGLION CYST EXCISION Left    wrist  . INGUINAL HERNIA REPAIR Right   . SOFT TISSUE MASS EXCISION Right 09/04/2009   gluteus  . TRIGGER FINGER RELEASE Right 10/03/2016   Procedure: RELEASE TRIGGER FINGER/A-1 PULLEY RIGHT THUMB;  Surgeon: Leanora Cover, MD;  Location: [facility];  Service: Orthopedics;  Laterality: Right;  . UTERINE SUSPENSION      Social History:  Ambulatory  Independently     reports that she quit smoking about 22 years ago. She has never used smokeless tobacco. She reports current alcohol use. She reports that she does not use drugs.  Family History:  Family History  Problem Relation Age of Onset  . Cancer Other   . Hypertension Other   . Hyperlipidemia Other   . Heart attack Other   . Cancer Mother        Colorectal  . Heart disease Father   . Heart  attack Father   . Cancer Sister        Colorectal  . Cancer Brother        Colorectal  . Heart disease Brother   . Heart attack Brother   . Heart disease Paternal Grandmother   . Heart disease Paternal Grandfather     Allergies: Allergies  Allergen Reactions  . Azo [Phenazopyridine] Hives and Swelling    FACIAL SWELLING, CHEST TIGHTNESS  . Morphine And Related Nausea And Vomiting and Other (See Comments)  DIZZINESS  . Opium Itching and Nausea And Vomiting    OPIODS/NARCOTICS - ITCHING OF NOSE, DIZZINESS  . Sulfa Antibiotics Rash     Prior to Admission medications   Medication Sig Start Date End Date Taking? Authorizing Provider  amLODipine (NORVASC) 5 MG tablet Take 5 mg by mouth daily.    [provider]  empagliflozin (JARDIANCE) 25 MG TABS tablet TAKE 25 MG BY MOUTH DAILY. 06/25/19   Philemon Kingdom, MD  fluticasone (FLONASE) 50 MCG/ACT nasal spray Place 1 spray into both nostrils daily. 12/03/18   [provider]  hydrochlorothiazide (HYDRODIURIL) 12.5 MG tablet Take 12.5 mg by mouth daily. 08/01/18   [provider]  meclizine (ANTIVERT) 25 MG tablet Take 25 mg by mouth 3 (three) times daily as needed for dizziness.    [provider]  metFORMIN (GLUCOPHAGE) 1000 MG tablet Take 1 tablet (1,000 mg total) by mouth 2 (two) times daily with a meal. 06/25/19   Philemon Kingdom, MD  metoprolol (LOPRESSOR) 50 MG tablet daily. 10/28/15   [provider]  simvastatin (ZOCOR) 20 MG tablet Take 20 mg by mouth daily.    [provider]  SUMAtriptan (IMITREX) 50 MG tablet Take 1 tablet (50 mg total) by mouth every 2 (two) hours as needed for migraine. May repeat in 2 hours if headache persists or recurs. 01/10/16   Marcial Pacas, MD  valsartan-hydrochlorothiazide (DIOVAN-HCT) 320-12.5 MG tablet Take 1 tablet by mouth daily.    [provider]   Physical Exam: Vitals with BMI 10/11/2019 10/11/2019 10/11/2019  Height - - -  Weight -  - -  BMI - - -  Systolic 810 175 102  Diastolic 75 585 99  Pulse 96 100 97    1. General:  in No  Acute distress   acutely ill -appearing 2. Psychological: Alert and  Oriented 3. Head/ENT:     Dry Mucous Membranes                          Head Non traumatic, neck supple                          Poor Dentition 4. SKIN:   decreased Skin turgor,  Skin clean Dry and intact no rash 5. Heart: Regular rate and rhythm no  Murmur, no Rub or gallop 6. Lungs:  , no wheezes or crackles   7. Abdomen: Soft,  non-tender, Non distended  bowel sounds present 8. Lower extremities: no clubbing, cyanosis, no  edema 9. Neurologically Grossly intact, moving all 4 extremities equally  10. MSK: Normal range of motion   All other LABS:     Recent Labs  Lab 10/11/19 1532  WBC 9.7  HGB 15.2*  HCT 46.6*  MCV 83.1  PLT 263     Recent Labs  Lab 10/11/19 1532  NA 137  K 3.7  CL 102  CO2 19*  GLUCOSE 299*  BUN 14  CREATININE 0.76  CALCIUM 9.3  MG 1.8     Recent Labs  Lab 10/11/19 1532  AST 21  ALT 22  ALKPHOS 70  BILITOT 0.7  PROT 7.8  ALBUMIN 4.0  Cultures:    Component Value Date/Time   SDES URINE, CATHETERIZED 05/27/2007 0945   SPECREQUEST NONE 05/27/2007 0945   CULT  05/27/2007 0945    Multiple bacterial morphotypes present, none predominant. Suggest appropriate recollection if clinically indicated.   REPTSTATUS 05/28/2007  FINAL 05/27/2007 0945     Radiological Exams on Admission: CT Angio Chest PE W and/or Wo Contrast  Result Date: 10/11/2019 CLINICAL DATA:  PE suspected, high prob Acute onset of chest pain while mourning husband's passing, has been with COVID positive. EXAM: CT ANGIOGRAPHY CHEST WITH CONTRAST TECHNIQUE: Multidetector CT imaging of the chest was performed using the standard protocol during bolus administration of intravenous contrast. Multiplanar CT image reconstructions and MIPs were obtained to evaluate the vascular anatomy. CONTRAST:  19mL OMNIPAQUE  IOHEXOL 350 MG/ML SOLN Initial contrast bolus was suboptimal, repeat injection with improved pulmonary artery opacification. COMPARISON:  Radiograph earlier today. FINDINGS: Cardiovascular: There are no filling defects within the pulmonary arteries to suggest pulmonary embolus. No aortic dissection. Mild aortic atherosclerosis. Ascending aortic ectasia at 4 cm, measured on series 9, image 62. Upper normal heart size. There are coronary artery calcifications. No pericardial effusion. Mediastinum/Nodes: Enlarged 15 mm right hilar node, likely reactive. Small mediastinal lymph nodes are not enlarged by size criteria. Esophagus slightly patulous. There is no esophageal wall thickening. Lungs/Pleura: Heterogeneous pulmonary parenchyma with geographic areas of interspersed ground-glass opacity. Mild central bronchial thickening. No pleural fluid. Trachea and central bronchi are patent. Upper Abdomen: No acute findings.  Cholecystectomy. Musculoskeletal: There are no acute or suspicious osseous abnormalities. Review of the MIP images confirms the above findings. IMPRESSION: 1. No pulmonary embolus. 2. Heterogeneous pulmonary parenchyma with geographic areas of interspersed ground-glass opacity. Findings likely represent a combination of small airways disease and COVID-19 pneumonia. 3. Mild central bronchial thickening. 4. Ectatic ascending aorta at 4 cm. Recommend annual imaging followup by CTA or MRA. This recommendation follows 2010 ACCF/AHA/AATS/ACR/ASA/SCA/SCAI/SIR/STS/SVM Guidelines for the Diagnosis and Management of Patients with Thoracic Aortic Disease. Circulation. 2010; 121: X833-A250. Aortic aneurysm NOS (ICD10-I71.9) Aortic Atherosclerosis (ICD10-I70.0). Electronically Signed   By: Keith Rake M.D.   On: 10/11/2019 18:08   DG Chest Portable 1 View  Result Date: 10/11/2019 CLINICAL DATA:  Dyspnea EXAM: PORTABLE CHEST 1 VIEW COMPARISON:  10/08/2019 FINDINGS: The heart size and mediastinal contours are  within normal limits. Both lungs are clear. The visualized skeletal structures are unremarkable. IMPRESSION: No active disease. Electronically Signed   By: Fidela Salisbury MD   On: 10/11/2019 16:13    Chart has been reviewed   Assessment/Plan  54 y.o. female with medical history significant of DM2,  HTN , HLD  recently diagnosed with COVID Admitted for Covid pNA and NSTEMI  Present on Admission: . Elevated troponin/NSTEMI (non-ST elevated myocardial infarction) ([place]) -- H=   2  ,E=0  ,A=  1 , R  2  , T  2   ,  for the  Total of 7 therefore will admit for observation and further evaluation ( Risk of MACE: Scores 0-3  of 0.9-1.7%.,  4-6: 12-16.6% , Scores ?7: 50-65% ) - CT A negative for significant PE  - troponin elevated continue to cycle -No acute ischemic changes on ECG    - monitor on telemetry, cycle cardiac enzymes, obtain serial ECG and  ECHO in AM.   - Daily aspirin  - heparin IV -  Further risk stratify with lipid panel, hgA1C, obtain TSH.  Make sure patient is on Aspirin.    Cardiology aware regarding patient's admission. Further management depends on pending  workup  . Hyperlipidemia - continue Zocor  . Obesity (BMI 30-39.9) chronic stable and would benefit from outpatient nutritional consult  . Pneumonia due to COVID-19 virus -  FROM HOME  WITH KNOWN HX  OF COVID19 ER Novel Corona Virus testing:  Ordered 10/11/19 and is  Pending  Immunization status: Not immunized Received monoclonal antibodies on 11 October 2019  Following concerning LAB/ imaging findings:        ANC/ALC ratio>3.5   Procalcitonin: low    CXR: hazy bilateral peripheral opacities   CT chest: GGO,     -Following work-up initiated:      sputum cultures  Ordered 10/11/19, Blood cultures Ordered 10/11/19,   CTA negative for PE   Following complications noted:  elevated troponin noted likely demand ischemia will continue to follow   Plan of treatment: Admit on [place] Precautions  -given  severity of illness cough bilateral infiltrates initiate steroids Decadron 6mg  q 24 hours And pharmacy consult for remdesivir   - Will follow daily d.dimer - Assess for ability to prone  - Supportive management -Fluid sparing resuscitation  -Provide oxygen as needed currently on room air  SpO2: 99 % -On heparin IV   Poor Prognostic factors  54 y.o.  Personal hx of  DM2,  HTN, obesity, NON-Vaccinated status Evidence of  organ damage  Present elevated trop,   ABS neutrophil to lymphocyte ratio >3.5 Risk factors for Cytokine storm LOW    Will order Airborne and Contact precautions  Family/ patient prognosis discussion: I have discussed case with the family/ patient  who are aware of their prognosis At this point they would like   to be full code     The treatment plan and use of medications and known side effects were discussed with patient  It was clearly explained that there is no proven definitive treatment for COVID-19 infection yet. Any medications used here are based on case reports/anecdotal data which are not peer-reviewed and has not been studied using randomized control trials.  Complete risks and long-term side effects are unknown, however in the best clinical judgment they seem to be of some clinical benefit rather than medical risks.  Patient  agree with the treatment plan and want to receive these treatments as indicated.      DM2 -initiate Covid protocol for diabetes management order sliding scale Levemir and Tradjenta Other plan as per orders.  DVT prophylaxis:  heparin      Code Status:    Code Status: Not on file FULL CODE  as per patient   I had personally discussed CODE STATUS with patient     Family Communication:   Family not at  Bedside patient just lost her Husband secondary to Covid today    Disposition Plan:    To home once workup is complete and patient is stable   Following barriers for discharge:                            Cardiac work-up  completed                             Pain controlled with PO medications                                                          Will need consultants to evaluate patient prior to discharge  Consults called: Cardiology  Admission status:  ED Disposition    ED Disposition Condition [place]: [place] [[id number]]  Level of Care: Telemetry Cardiac [[id number]]  I expect the patient will be discharged within 24 hours: No (not a candidate for 5C-Observation unit)  Covid Evaluation: Confirmed COVID Positive  Diagnosis: Elevated troponin [[id number]]  Admitting Physician: Toy Baker [[id number]]  Attending Physician: Toy Baker [[id number]]        Obs     Level of care     tele    indefinitely please discontinue once patient no longer qualifies COVID-19 Labs   No results found for: SARSCOV2NAA   Precautions: admitted as  covid positive No active isolations    PPE: Used by the provider:  P100  eye Goggles,  Gloves gown  Jesusa Stenerson 10/12/2019, 12:54 AM    [facility]     after 2 AM please page floor coverage PA If 7AM-7PM, please contact the day team taking care of the patient using [email or web address]   Patient was evaluated in the context of the global COVID-19 pandemic, which necessitated consideration that the patient might be at risk for infection with the SARS-CoV-2 virus that causes COVID-19. Institutional protocols and algorithms that pertain to the evaluation of patients at risk for COVID-19 are in a state of rapid change based on information released by regulatory bodies including the CDC and federal and state organizations. These policies and algorithms were followed during the patient's care.

## 2019-10-12 ENCOUNTER — Emergency Department (HOSPITAL_COMMUNITY): Payer: No Typology Code available for payment source

## 2019-10-12 ENCOUNTER — Encounter (HOSPITAL_COMMUNITY): Payer: Self-pay | Admitting: Internal Medicine

## 2019-10-12 DIAGNOSIS — Z794 Long term (current) use of insulin: Secondary | ICD-10-CM | POA: Diagnosis not present

## 2019-10-12 DIAGNOSIS — Z87891 Personal history of nicotine dependence: Secondary | ICD-10-CM | POA: Diagnosis not present

## 2019-10-12 DIAGNOSIS — E11319 Type 2 diabetes mellitus with unspecified diabetic retinopathy without macular edema: Secondary | ICD-10-CM | POA: Diagnosis present

## 2019-10-12 DIAGNOSIS — Z809 Family history of malignant neoplasm, unspecified: Secondary | ICD-10-CM | POA: Diagnosis not present

## 2019-10-12 DIAGNOSIS — Z79899 Other long term (current) drug therapy: Secondary | ICD-10-CM | POA: Diagnosis not present

## 2019-10-12 DIAGNOSIS — Z885 Allergy status to narcotic agent status: Secondary | ICD-10-CM | POA: Diagnosis not present

## 2019-10-12 DIAGNOSIS — I214 Non-ST elevation (NSTEMI) myocardial infarction: Secondary | ICD-10-CM | POA: Diagnosis not present

## 2019-10-12 DIAGNOSIS — F40240 Claustrophobia: Secondary | ICD-10-CM | POA: Diagnosis present

## 2019-10-12 DIAGNOSIS — U071 COVID-19: Secondary | ICD-10-CM | POA: Diagnosis present

## 2019-10-12 DIAGNOSIS — Z8249 Family history of ischemic heart disease and other diseases of the circulatory system: Secondary | ICD-10-CM | POA: Diagnosis not present

## 2019-10-12 DIAGNOSIS — Z888 Allergy status to other drugs, medicaments and biological substances status: Secondary | ICD-10-CM | POA: Diagnosis not present

## 2019-10-12 DIAGNOSIS — J1282 Pneumonia due to coronavirus disease 2019: Secondary | ICD-10-CM | POA: Diagnosis present

## 2019-10-12 DIAGNOSIS — R002 Palpitations: Secondary | ICD-10-CM | POA: Diagnosis not present

## 2019-10-12 DIAGNOSIS — R0789 Other chest pain: Secondary | ICD-10-CM | POA: Diagnosis present

## 2019-10-12 DIAGNOSIS — G43909 Migraine, unspecified, not intractable, without status migrainosus: Secondary | ICD-10-CM | POA: Diagnosis present

## 2019-10-12 DIAGNOSIS — E785 Hyperlipidemia, unspecified: Secondary | ICD-10-CM | POA: Diagnosis present

## 2019-10-12 DIAGNOSIS — I77810 Thoracic aortic ectasia: Secondary | ICD-10-CM | POA: Diagnosis present

## 2019-10-12 DIAGNOSIS — Z6834 Body mass index (BMI) 34.0-34.9, adult: Secondary | ICD-10-CM | POA: Diagnosis not present

## 2019-10-12 DIAGNOSIS — I248 Other forms of acute ischemic heart disease: Secondary | ICD-10-CM | POA: Diagnosis present

## 2019-10-12 DIAGNOSIS — E876 Hypokalemia: Secondary | ICD-10-CM | POA: Diagnosis present

## 2019-10-12 DIAGNOSIS — F439 Reaction to severe stress, unspecified: Secondary | ICD-10-CM | POA: Diagnosis present

## 2019-10-12 DIAGNOSIS — R000 Tachycardia, unspecified: Secondary | ICD-10-CM | POA: Diagnosis present

## 2019-10-12 DIAGNOSIS — R778 Other specified abnormalities of plasma proteins: Secondary | ICD-10-CM | POA: Diagnosis present

## 2019-10-12 DIAGNOSIS — I10 Essential (primary) hypertension: Secondary | ICD-10-CM | POA: Diagnosis present

## 2019-10-12 DIAGNOSIS — E7800 Pure hypercholesterolemia, unspecified: Secondary | ICD-10-CM | POA: Diagnosis present

## 2019-10-12 DIAGNOSIS — Z882 Allergy status to sulfonamides status: Secondary | ICD-10-CM | POA: Diagnosis not present

## 2019-10-12 DIAGNOSIS — E669 Obesity, unspecified: Secondary | ICD-10-CM | POA: Diagnosis present

## 2019-10-12 LAB — CBC WITH DIFFERENTIAL/PLATELET
Abs Immature Granulocytes: 0.04 10*3/uL (ref 0.00–0.07)
Basophils Absolute: 0 10*3/uL (ref 0.0–0.1)
Basophils Relative: 0 %
Eosinophils Absolute: 0 10*3/uL (ref 0.0–0.5)
Eosinophils Relative: 0 %
HCT: 43.9 % (ref 36.0–46.0)
Hemoglobin: 14.5 g/dL (ref 12.0–15.0)
Immature Granulocytes: 1 %
Lymphocytes Relative: 21 %
Lymphs Abs: 1.6 10*3/uL (ref 0.7–4.0)
MCH: 28 pg (ref 26.0–34.0)
MCHC: 33 g/dL (ref 30.0–36.0)
MCV: 84.7 fL (ref 80.0–100.0)
Monocytes Absolute: 1.1 10*3/uL — ABNORMAL HIGH (ref 0.1–1.0)
Monocytes Relative: 15 %
Neutro Abs: 4.8 10*3/uL (ref 1.7–7.7)
Neutrophils Relative %: 63 %
Platelets: 261 10*3/uL (ref 150–400)
RBC: 5.18 MIL/uL — ABNORMAL HIGH (ref 3.87–5.11)
RDW: 12.9 % (ref 11.5–15.5)
WBC: 7.6 10*3/uL (ref 4.0–10.5)
nRBC: 0 % (ref 0.0–0.2)

## 2019-10-12 LAB — CBG MONITORING, ED
Glucose-Capillary: 200 mg/dL — ABNORMAL HIGH (ref 70–99)
Glucose-Capillary: 149 mg/dL — ABNORMAL HIGH (ref 70–99)
Glucose-Capillary: 218 mg/dL — ABNORMAL HIGH (ref 70–99)
Glucose-Capillary: 171 mg/dL — ABNORMAL HIGH (ref 70–99)
Glucose-Capillary: 163 mg/dL — ABNORMAL HIGH (ref 70–99)
Glucose-Capillary: 177 mg/dL — ABNORMAL HIGH (ref 70–99)
Glucose-Capillary: 216 mg/dL — ABNORMAL HIGH (ref 70–99)
Glucose-Capillary: 149 mg/dL — ABNORMAL HIGH (ref 70–99)

## 2019-10-12 LAB — HIV ANTIBODY (ROUTINE TESTING W REFLEX): HIV Screen 4th Generation wRfx: NONREACTIVE

## 2019-10-12 LAB — COMPREHENSIVE METABOLIC PANEL
ALT: 20 U/L (ref 0–44)
AST: 18 U/L (ref 15–41)
Albumin: 3.4 g/dL — ABNORMAL LOW (ref 3.5–5.0)
Alkaline Phosphatase: 57 U/L (ref 38–126)
Anion gap: 10 (ref 5–15)
BUN: 12 mg/dL (ref 6–20)
CO2: 25 mmol/L (ref 22–32)
Calcium: 8.8 mg/dL — ABNORMAL LOW (ref 8.9–10.3)
Chloride: 106 mmol/L (ref 98–111)
Creatinine, Ser: 0.58 mg/dL (ref 0.44–1.00)
GFR calc Af Amer: >60 mL/min (ref >60)
GFR calc non Af Amer: >60 mL/min (ref >60)
Glucose, Bld: 154 mg/dL — ABNORMAL HIGH (ref 70–99)
Potassium: 3.4 mmol/L — ABNORMAL LOW (ref 3.5–5.1)
Sodium: 141 mmol/L (ref 135–145)
Total Bilirubin: 0.6 mg/dL (ref 0.3–1.2)
Total Protein: 6.7 g/dL (ref 6.5–8.1)

## 2019-10-12 LAB — ECHOCARDIOGRAM LIMITED
Area-P 1/2: 2.73 cm2
Height: 67 in
S' Lateral: 2.1 cm
Weight: 3520 oz

## 2019-10-12 LAB — LIPID PANEL
Cholesterol: 149 mg/dL (ref 0–200)
HDL: 23 mg/dL — ABNORMAL LOW (ref >40)
LDL Cholesterol: 90 mg/dL (ref 0–99)
Total CHOL/HDL Ratio: 6.5 RATIO
Triglycerides: 181 mg/dL — ABNORMAL HIGH (ref <150)
VLDL: 36 mg/dL (ref 0–40)

## 2019-10-12 LAB — TROPONIN I (HIGH SENSITIVITY)
Troponin I (High Sensitivity): 283 ng/L (ref <18)
Troponin I (High Sensitivity): 348 ng/L (ref <18)
Troponin I (High Sensitivity): 434 ng/L (ref <18)

## 2019-10-12 LAB — MAGNESIUM: Magnesium: 1.9 mg/dL (ref 1.7–2.4)

## 2019-10-12 LAB — SARS CORONAVIRUS 2 BY RT PCR (HOSPITAL ORDER, PERFORMED IN ~~LOC~~ HOSPITAL LAB): SARS Coronavirus 2: POSITIVE — AB

## 2019-10-12 LAB — FERRITIN: Ferritin: 218 ng/mL (ref 11–307)

## 2019-10-12 LAB — C-REACTIVE PROTEIN: CRP: 2 mg/dL — ABNORMAL HIGH (ref <1.0)

## 2019-10-12 LAB — PHOSPHORUS: Phosphorus: 3.8 mg/dL (ref 2.5–4.6)

## 2019-10-12 LAB — TSH: TSH: 1.391 u[IU]/mL (ref 0.350–4.500)

## 2019-10-12 LAB — HEPARIN LEVEL (UNFRACTIONATED)
Heparin Unfractionated: 0.16 IU/mL — ABNORMAL LOW (ref 0.30–0.70)
Heparin Unfractionated: 0.13 IU/mL — ABNORMAL LOW (ref 0.30–0.70)
Heparin Unfractionated: 0.14 IU/mL — ABNORMAL LOW (ref 0.30–0.70)

## 2019-10-12 LAB — D-DIMER, QUANTITATIVE: D-Dimer, Quant: <0.27 ug/mL-FEU (ref 0.00–0.50)

## 2019-10-12 MED ORDER — POLYETHYLENE GLYCOL 3350 17 G PO PACK: 17.0000 g | PACK | Freq: Every day | ORAL | Status: DC | PRN

## 2019-10-12 MED ORDER — ONDANSETRON HCL 4 MG PO TABS: 4.0000 mg | ORAL_TABLET | Freq: Four times a day (QID) | ORAL | Status: DC | PRN

## 2019-10-12 MED ORDER — HEPARIN BOLUS VIA INFUSION
2000.0000 [IU] | Freq: Once | INTRAVENOUS | Status: AC
  Administered 2019-10-12: 2000 [IU] via INTRAVENOUS
  Filled 2019-10-12: qty 2000

## 2019-10-12 MED ORDER — HEPARIN BOLUS VIA INFUSION
1000.0000 [IU] | Freq: Once | INTRAVENOUS | Status: AC
  Administered 2019-10-12: 1000 [IU] via INTRAVENOUS
  Filled 2019-10-12: qty 1000

## 2019-10-12 MED ORDER — GUAIFENESIN-DM 100-10 MG/5ML PO SYRP
10.0000 mL | ORAL_SOLUTION | ORAL | Status: DC | PRN
  Administered 2019-10-12: 10 mL via ORAL
  Filled 2019-10-12: qty 10

## 2019-10-12 MED ORDER — SODIUM CHLORIDE 0.9% FLUSH: 3.0000 mL | INTRAVENOUS | Status: DC | PRN

## 2019-10-12 MED ORDER — ZINC SULFATE 220 (50 ZN) MG PO CAPS
220.0000 mg | ORAL_CAPSULE | Freq: Every day | ORAL | Status: DC
  Administered 2019-10-12 – 2019-10-13 (×2): 220 mg via ORAL
  Filled 2019-10-12 (×2): qty 1

## 2019-10-12 MED ORDER — SODIUM CHLORIDE 0.9% FLUSH
3.0000 mL | Freq: Two times a day (BID) | INTRAVENOUS | Status: DC
  Administered 2019-10-12 – 2019-10-13 (×2): 3 mL via INTRAVENOUS

## 2019-10-12 MED ORDER — IPRATROPIUM-ALBUTEROL 20-100 MCG/ACT IN AERS
1.0000 | INHALATION_SPRAY | Freq: Four times a day (QID) | RESPIRATORY_TRACT | Status: DC
  Filled 2019-10-12: qty 4

## 2019-10-12 MED ORDER — ASCORBIC ACID 500 MG PO TABS
500.0000 mg | ORAL_TABLET | Freq: Every day | ORAL | Status: DC
  Administered 2019-10-12 – 2019-10-13 (×2): 500 mg via ORAL
  Filled 2019-10-12 (×2): qty 1

## 2019-10-12 MED ORDER — POTASSIUM CHLORIDE 10 MEQ/100ML IV SOLN
10.0000 meq | INTRAVENOUS | Status: AC
  Administered 2019-10-12 (×4): 10 meq via INTRAVENOUS
  Filled 2019-10-12 (×4): qty 100

## 2019-10-12 MED ORDER — METOPROLOL TARTRATE 25 MG PO TABS
25.0000 mg | ORAL_TABLET | Freq: Two times a day (BID) | ORAL | Status: DC
  Administered 2019-10-12 – 2019-10-13 (×4): 25 mg via ORAL
  Filled 2019-10-12 (×4): qty 1

## 2019-10-12 MED ORDER — SIMVASTATIN 20 MG PO TABS
20.0000 mg | ORAL_TABLET | Freq: Every day | ORAL | Status: DC
  Administered 2019-10-12 – 2019-10-13 (×2): 20 mg via ORAL
  Filled 2019-10-12 (×2): qty 1

## 2019-10-12 MED ORDER — SODIUM CHLORIDE 0.9 % IV SOLN: 250.0000 mL | INTRAVENOUS | Status: DC | PRN

## 2019-10-12 MED ORDER — DEXAMETHASONE SODIUM PHOSPHATE 10 MG/ML IJ SOLN
6.0000 mg | INTRAMUSCULAR | Status: DC
  Administered 2019-10-12 – 2019-10-13 (×2): 6 mg via INTRAVENOUS
  Filled 2019-10-12 (×2): qty 1

## 2019-10-12 MED ORDER — ONDANSETRON HCL 4 MG/2ML IJ SOLN: 4.0000 mg | Freq: Four times a day (QID) | INTRAMUSCULAR | Status: DC | PRN

## 2019-10-12 MED ORDER — ACETAMINOPHEN 325 MG PO TABS: 650.0000 mg | ORAL_TABLET | Freq: Four times a day (QID) | ORAL | Status: DC | PRN

## 2019-10-12 MED ORDER — SENNOSIDES-DOCUSATE SODIUM 8.6-50 MG PO TABS: 2.0000 | ORAL_TABLET | Freq: Every evening | ORAL | Status: DC | PRN

## 2019-10-12 MED ORDER — AMLODIPINE BESYLATE 5 MG PO TABS
5.0000 mg | ORAL_TABLET | Freq: Every day | ORAL | Status: DC
  Administered 2019-10-12 – 2019-10-13 (×2): 5 mg via ORAL
  Filled 2019-10-12 (×2): qty 1

## 2019-10-12 NOTE — Progress Notes (Signed)
PROGRESS NOTE    [name]  WUJ:[id number] DOB: 1965-09-20 DOA: 10/11/2019 PCP: Leeanne Rio, MD   Brief Narrative:  54 year old with history of DM2, HTN, HLD diagnosed of Covid about 6 days ago admitted for chest pain.  Sadly her husband was deceased recently and while being with her deceased husband she developed chest pain.  Upon admission her troponins were trending upwards, cardiology was consulted and started on heparin drip.   Assessment & Plan:   Active Problems:   Controlled type 2 diabetes mellitus with retinopathy of left eye, with long-term current use of insulin (HCC)   Hyperlipidemia   Obesity (BMI 30-39.9)   Elevated troponin   NSTEMI (non-ST elevated myocardial infarction) ([place])   Pneumonia due to COVID-19 virus   Chest pain concerning for ACS, NSTEMI -Elevated troponin, continue to trend until it peaks -Family history of premature CAD -CTA chest-negative for PE, coronary artery calcifications noted -Currently on heparin drip, suspect at least 48 hours if opting for conservative management. -Echocardiogram to look for any wall motion abnormalities -LDL-90, A1c, TSH pending -Cardiology consulted -Daily aspirin  COVID-19 pneumonia with groundglass opacity -Oxygen levels-currently on room air -Patient has not been vaccinated.  Recently received monoclonal antibody infusion on 10/11/2019. -Chest x-ray-negative -CTA chest-no PE but shows groundglass opacities -As needed bronchodilators -Procalcitonin-negative -Vitamin C, zinc -Remdesivir-D1 -Decadron 6 mg IV every 24 hours-D1  Hyperlipidemia -Zocor  Diabetes mellitus type 2, insulin-dependent -Tradjenta 5 mg daily, Levemir 7 units twice daily -Sliding scale  Essential hypertension -Norvasc 5 mg orally daily, metoprolol 25 mg twice daily    DVT prophylaxis: Heparin drip Code Status: Full code Family Communication: States she will update the family.  I have offered help in updating if  necessary  Dispo: The patient is from: Home              Anticipated d/c is to: Home              Anticipated d/c date is: 2 days              Patient currently is not medically stable to d/c.  Maintain hospital stay while patient is undergoing work-up for NSTEMI.  Also getting remdesivir and steroids for COVID-19 pneumonia with infiltrates.    Body mass index is 34.46 kg/m.    Subjective: Patient tells me she has history of premature CAD in the family but personally has never had any known cardiac disease.  Had cardiac cath back in 2019 which was clean according to her.  She was diagnosed with Covid earlier last week and while her and her husband were going for COVID-19 antibody infusion yesterday, he had a cardiac arrest.  Patient started CPR and called EMS.  During this time she developed chest pain and came to the hospital. During my evaluation she denies any complaints including chest pain, shortness of breath.  She feels better now.  Review of Systems Otherwise negative except as per HPI, including: General: Denies fever, chills, night sweats or unintended weight loss. Resp: Denies cough, wheezing, shortness of breath. Cardiac: Denies chest pain, palpitations, orthopnea, paroxysmal nocturnal dyspnea. GI: Denies abdominal pain, nausea, vomiting, diarrhea or constipation GU: Denies dysuria, frequency, hesitancy or incontinence MS: Denies muscle aches, joint pain or swelling Neuro: Denies headache, neurologic deficits (focal weakness, numbness, tingling), abnormal gait Psych: Denies anxiety, depression, SI/HI/AVH Skin: Denies new rashes or lesions ID: Denies sick contacts, exotic exposures, travel  Examination:  General exam: Appears calm and comfortable  Respiratory  system: mild b/l rhonchi Cardiovascular system: S1 & S2 heard, RRR. No JVD, murmurs, rubs, gallops or clicks. No pedal edema. Gastrointestinal system: Abdomen is nondistended, soft and nontender. No organomegaly or  masses felt. Normal bowel sounds heard. Central nervous system: Alert and oriented. No focal neurological deficits. Extremities: Symmetric 5 x 5 power. Skin: No rashes, lesions or ulcers Psychiatry: Judgement and insight appear normal. Mood & affect appropriate.     Objective: Vitals:   10/12/19 0300 10/12/19 0400 10/12/19 0430 10/12/19 0502  BP: (!) 142/74 (!) 156/82 (!) 155/87 (!) 165/83  Pulse: 79 72 79 84  Resp: 19 20 15    Temp:      TempSrc:      SpO2: 95% 91% 96%   Weight:      Height:        Intake/Output Summary (Last 24 hours) at 10/12/2019 0811 Last data filed at 10/11/2019 2000 Gross per 24 hour  Intake 610 ml  Output --  Net 610 ml   Filed Weights   10/11/19 1527  Weight: 99.8 kg     Data Reviewed:   CBC: Recent Labs  Lab 10/11/19 1532 10/11/19 2219 10/12/19 0403  WBC 9.7 8.3 7.6  NEUTROABS  --  6.0 4.8  HGB 15.2* 14.4 14.5  HCT 46.6* 44.4 43.9  MCV 83.1 82.5 84.7  PLT 263 303 193   Basic Metabolic Panel: Recent Labs  Lab 10/11/19 1532 10/12/19 0403  NA 137 141  K 3.7 3.4*  CL 102 106  CO2 19* 25  GLUCOSE 299* 154*  BUN 14 12  CREATININE 0.76 0.58  CALCIUM 9.3 8.8*  MG 1.8 1.9  PHOS  --  3.8   GFR: Estimated Creatinine Clearance: 96.5 mL/min (by C-G formula based on SCr of 0.58 mg/dL). Liver Function Tests: Recent Labs  Lab 10/11/19 1532 10/12/19 0403  AST 21 18  ALT 22 20  ALKPHOS 70 57  BILITOT 0.7 0.6  PROT 7.8 6.7  ALBUMIN 4.0 3.4*   No results for input(s): LIPASE, AMYLASE in the last 168 hours. No results for input(s): AMMONIA in the last 168 hours. Coagulation Profile: No results for input(s): INR, PROTIME in the last 168 hours. Cardiac Enzymes: No results for input(s): CKTOTAL, CKMB, CKMBINDEX, TROPONINI in the last 168 hours. BNP (last 3 results) No results for input(s): PROBNP in the last 8760 hours. HbA1C: No results for input(s): HGBA1C in the last 72 hours. CBG: Recent Labs  Lab 10/11/19 1556  10/11/19 2323 10/12/19 0209 10/12/19 0507  GLUCAP 269* 190* 200* 149*   Lipid Profile: Recent Labs    10/12/19 0243  CHOL 149  HDL 23*  LDLCALC 90  TRIG 181*  CHOLHDL 6.5   Thyroid Function Tests: No results for input(s): TSH, T4TOTAL, FREET4, T3FREE, THYROIDAB in the last 72 hours. Anemia Panel: Recent Labs    10/11/19 2219 10/12/19 0403  FERRITIN 231 218   Sepsis Labs: Recent Labs  Lab 10/11/19 2219  PROCALCITON <0.10    Recent Results (from the past 240 hour(s))  SARS Coronavirus 2 by RT PCR (hospital order, performed in [facility] hospital lab) Nasopharyngeal Nasopharyngeal Swab     Status: Abnormal   Collection Time: 10/11/19 11:28 PM   Specimen: Nasopharyngeal Swab  Result Value Ref Range Status   SARS Coronavirus 2 POSITIVE (A) NEGATIVE Final    Comment: RESULT CALLED TO, READ BACK BY AND VERIFIED WITH: RN SETRAN K. AT [place]. ON 10/12/2019 (NOTE) SARS-CoV-2 target nucleic acids are  DETECTED  SARS-CoV-2 RNA is generally detectable in upper respiratory specimens  during the acute phase of infection.  Positive results are indicative  of the presence of the identified virus, but do not rule out bacterial infection or co-infection with other pathogens not detected by the test.  Clinical correlation with patient history and  other diagnostic information is necessary to determine patient infection status.  The expected result is negative.  Fact Sheet for Patients:   StrictlyIdeas.no   Fact Sheet for Healthcare Providers:   BankingDealers.co.za    This test is not yet approved or cleared by the [place] FDA and  has been authorized for detection and/or diagnosis of SARS-CoV-2 by FDA under an Emergency Use Authorization (EUA).  This EUA will remain in effect (mean ing this test can be used) for the duration of  the COVID-19 declaration under Section 564(b)(1) of the Act, 21 U.S.C. section  360-bbb-3(b)(1), unless the authorization is terminated or revoked sooner.  Performed at [facility], [address], [place], [place] [address]          Radiology Studies: CT Angio Chest PE W and/or Wo Contrast  Result Date: 10/11/2019 CLINICAL DATA:  PE suspected, high prob Acute onset of chest pain while mourning husband's passing, has been with COVID positive. EXAM: CT ANGIOGRAPHY CHEST WITH CONTRAST TECHNIQUE: Multidetector CT imaging of the chest was performed using the standard protocol during bolus administration of intravenous contrast. Multiplanar CT image reconstructions and MIPs were obtained to evaluate the vascular anatomy. CONTRAST:  17mL OMNIPAQUE IOHEXOL 350 MG/ML SOLN Initial contrast bolus was suboptimal, repeat injection with improved pulmonary artery opacification. COMPARISON:  Radiograph earlier today. FINDINGS: Cardiovascular: There are no filling defects within the pulmonary arteries to suggest pulmonary embolus. No aortic dissection. Mild aortic atherosclerosis. Ascending aortic ectasia at 4 cm, measured on series 9, image 62. Upper normal heart size. There are coronary artery calcifications. No pericardial effusion. Mediastinum/Nodes: Enlarged 15 mm right hilar node, likely reactive. Small mediastinal lymph nodes are not enlarged by size criteria. Esophagus slightly patulous. There is no esophageal wall thickening. Lungs/Pleura: Heterogeneous pulmonary parenchyma with geographic areas of interspersed ground-glass opacity. Mild central bronchial thickening. No pleural fluid. Trachea and central bronchi are patent. Upper Abdomen: No acute findings.  Cholecystectomy. Musculoskeletal: There are no acute or suspicious osseous abnormalities. Review of the MIP images confirms the above findings. IMPRESSION: 1. No pulmonary embolus. 2. Heterogeneous pulmonary parenchyma with geographic areas of interspersed ground-glass opacity. Findings likely represent a combination of small  airways disease and COVID-19 pneumonia. 3. Mild central bronchial thickening. 4. Ectatic ascending aorta at 4 cm. Recommend annual imaging followup by CTA or MRA. This recommendation follows 2010 ACCF/AHA/AATS/ACR/ASA/SCA/SCAI/SIR/STS/SVM Guidelines for the Diagnosis and Management of Patients with Thoracic Aortic Disease. Circulation. 2010; 121: H852-D782. Aortic aneurysm NOS (ICD10-I71.9) Aortic Atherosclerosis (ICD10-I70.0). Electronically Signed   By: Keith Rake M.D.   On: 10/11/2019 18:08   DG Chest Portable 1 View  Result Date: 10/11/2019 CLINICAL DATA:  Dyspnea EXAM: PORTABLE CHEST 1 VIEW COMPARISON:  10/08/2019 FINDINGS: The heart size and mediastinal contours are within normal limits. Both lungs are clear. The visualized skeletal structures are unremarkable. IMPRESSION: No active disease. Electronically Signed   By: Fidela Salisbury MD   On: 10/11/2019 16:13        Scheduled Meds: . amLODipine  5 mg Oral Daily  . vitamin C  500 mg Oral Daily  . aspirin EC  81 mg Oral Daily  . dexamethasone (DECADRON)  injection  6 mg Intravenous Q24H  . insulin aspart  0-9 Units Subcutaneous Q4H  . insulin detemir  0.075 Units/kg Subcutaneous BID  . linagliptin  5 mg Oral Daily  . metoprolol tartrate  25 mg Oral BID  . simvastatin  20 mg Oral Daily  . sodium chloride flush  3 mL Intravenous Q12H  . sodium chloride flush  3 mL Intravenous Q12H  . zinc sulfate  220 mg Oral Daily   Continuous Infusions: . sodium chloride    . sodium chloride    . famotidine (PEPCID) IV    . heparin 1,200 Units/hr (10/12/19 0556)  . remdesivir 100 mg in NS 100 mL       LOS: 0 days   Time spent= 35 mins    Daulton Harbaugh Arsenio Loader, MD [facility]  If 7PM-7AM, please contact night-coverage  10/12/2019, 8:11 AM

## 2019-10-12 NOTE — Progress Notes (Signed)
[place] for Heparin  Indication: chest pain/ACS  Allergies  Allergen Reactions  . Azo [Phenazopyridine] Hives and Swelling    FACIAL SWELLING, CHEST TIGHTNESS  . Morphine And Related Nausea And Vomiting and Other (See Comments)    DIZZINESS  . Opium Itching and Nausea And Vomiting    OPIODS/NARCOTICS - ITCHING OF NOSE, DIZZINESS  . Sulfa Antibiotics Rash    Patient Measurements: Height: 5\' 7"  (170.2 cm) Weight: 99.8 kg (220 lb) IBW/kg (Calculated) : 61.6 Heparin Dosing Weight: 83.8 kg   Vital Signs: BP: 110/76 (09/07 1800) Pulse Rate: 76 (09/07 1800)  Labs: Recent Labs    10/11/19 1532 10/11/19 1847 10/11/19 2219 10/11/19 2219 10/12/19 0243 10/12/19 0403 10/12/19 0920 10/12/19 1215 10/12/19 1300 10/12/19 2000  HGB 15.2*   < > 14.4  --   --  14.5  --   --   --   --   HCT 46.6*  --  44.4  --   --  43.9  --   --   --   --   PLT 263  --  303  --   --  261  --   --   --   --   HEPARINUNFRC  --   --   --   --   --  0.16*  --   --  0.13* 0.14*  CREATININE 0.76  --   --   --   --  0.58  --   --   --   --   TROPONINIHS 61*   < > 251*   < > 283*  --  348* 434*  --   --    < > = values in this interval not displayed.    Estimated Creatinine Clearance: 96.5 mL/min (by C-G formula based on SCr of 0.58 mg/dL).   Medical History: Past Medical History:  Diagnosis Date  . Claustrophobia   . Dental crown present    lower right  . Diabetic retinopathy (HCC)    OS  . Hyperlipidemia   . Hypertension    states under control with meds., has been on med. x 26 yrs.; states blood pressure goes up when she gets nervous  . Insulin dependent diabetes mellitus   . Migraines   . Obesity   . Palpitations   . Trigger thumb of right hand 09/2016    Assessment: 54 yr old female presented to ED with chest pain and rising troponins. Pharmacy was consulted to start IV heparin for ACS.    Heparin level ~5.5 hrs after heparin 2000 units IV bolus  X 1 and increasing heparin infusion to 1400 units/hr was 0.14 units/ml; heparin level remains subtherapeutic, despite heparin infusion rate increase. H/H, platelets WNL. Per RN, no issues with IV or bleeding observed.  Goal of Therapy:  Heparin level 0.3-0.7 units/ml Monitor platelets by anticoagulation protocol: Yes   Plan:  Heparin 2000 units IV X 1 Increase heparin infusion to 1650 units/hr Check heparin level in 6 hours Monitor daily heparin level, CBC Monitor for signs/symptoms of bleeding  Gillermina Hu, PharmD, BCPS, [facility] Clinical Pharmacist 10/12/2019 9:10 PM

## 2019-10-12 NOTE — Progress Notes (Signed)
ANTICOAGULATION CONSULT NOTE   Pharmacy Consult for heparin  Indication: chest pain/ACS  Assessment: 42 YOF with chest pain and rising troponins to start IV heparin for ACS. H/H high. Plt wnl. SCr wnl  Initial heparin 0.16 units/ml  Goal of Therapy:  Heparin level 0.3-0.7 units/ml Monitor platelets by anticoagulation protocol: Yes   Plan:  -Heparin bolus 1000 units and increase 1200 units/hr -F/u 6 hr HL -Monitor daily HL, CBC and s/s of bleeding  Thanks for allowing pharmacy to be a part of this patient's care.  Excell Seltzer, PharmD Clinical Pharmacist
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
## 2019-10-12 NOTE — Progress Notes (Signed)
[facility] for heparin  Indication: chest pain/ACS  Allergies  Allergen Reactions  . Azo [Phenazopyridine] Hives and Swelling    FACIAL SWELLING, CHEST TIGHTNESS  . Morphine And Related Nausea And Vomiting and Other (See Comments)    DIZZINESS  . Opium Itching and Nausea And Vomiting    OPIODS/NARCOTICS - ITCHING OF NOSE, DIZZINESS  . Sulfa Antibiotics Rash    Patient Measurements: Height: 5\' 7"  (170.2 cm) Weight: 99.8 kg (220 lb) IBW/kg (Calculated) : 61.6 Heparin Dosing Weight: 83.8 kg   Vital Signs: BP: 108/84 (09/07 1200) Pulse Rate: 78 (09/07 1200)  Labs: Recent Labs    10/11/19 1532 10/11/19 1847 10/11/19 2219 10/11/19 2219 10/12/19 0243 10/12/19 0403 10/12/19 0920 10/12/19 1215 10/12/19 1300  HGB 15.2*   < > 14.4  --   --  14.5  --   --   --   HCT 46.6*  --  44.4  --   --  43.9  --   --   --   PLT 263  --  303  --   --  261  --   --   --   HEPARINUNFRC  --   --   --   --   --  0.16*  --   --  0.13*  CREATININE 0.76  --   --   --   --  0.58  --   --   --   TROPONINIHS 61*   < > 251*   < > 283*  --  348* 434*  --    < > = values in this interval not displayed.    Estimated Creatinine Clearance: 96.5 mL/min (by C-G formula based on SCr of 0.58 mg/dL).   Medical History: Past Medical History:  Diagnosis Date  . Claustrophobia   . Dental crown present    lower right  . Diabetic retinopathy (HCC)    OS  . Hyperlipidemia   . Hypertension    states under control with meds., has been on med. x 26 yrs.; states blood pressure goes up when she gets nervous  . Insulin dependent diabetes mellitus   . Migraines   . Obesity   . Palpitations   . Trigger thumb of right hand 09/2016    Medications:  (Not in a hospital admission)   Assessment: 20 YOF with chest pain and rising troponins to start IV heparin for ACS. H/H high. Plt wnl. SCr wnl   Heparin level subtherapeutic s/p rate increase to 1200 units/hr, no infusion  issues   Goal of Therapy:  Heparin level 0.3-0.7 units/ml Monitor platelets by anticoagulation protocol: Yes   Plan:  Heparin 2000 units IV x 1, increase gtt to 1400 units/hr F/u 6 hour heparin level   Bertis Ruddy, PharmD Clinical Pharmacist ED Pharmacist Phone # [phone] 10/12/2019 2:39 PM

## 2019-10-12 NOTE — Progress Notes (Signed)
  Echocardiogram 2D Echocardiogram has been performed.  [name] 10/12/2019, 1:58 PM

## 2019-10-13 ENCOUNTER — Ambulatory Visit (INDEPENDENT_AMBULATORY_CARE_PROVIDER_SITE_OTHER): Payer: No Typology Code available for payment source

## 2019-10-13 DIAGNOSIS — R002 Palpitations: Secondary | ICD-10-CM

## 2019-10-13 DIAGNOSIS — I248 Other forms of acute ischemic heart disease: Secondary | ICD-10-CM

## 2019-10-13 LAB — GLUCOSE, CAPILLARY
Glucose-Capillary: 224 mg/dL — ABNORMAL HIGH (ref 70–99)
Glucose-Capillary: 214 mg/dL — ABNORMAL HIGH (ref 70–99)

## 2019-10-13 LAB — COMPREHENSIVE METABOLIC PANEL
ALT: 24 U/L (ref 0–44)
AST: 22 U/L (ref 15–41)
Albumin: 3.2 g/dL — ABNORMAL LOW (ref 3.5–5.0)
Alkaline Phosphatase: 50 U/L (ref 38–126)
Anion gap: 8 (ref 5–15)
BUN: 18 mg/dL (ref 6–20)
CO2: 21 mmol/L — ABNORMAL LOW (ref 22–32)
Calcium: 8.5 mg/dL — ABNORMAL LOW (ref 8.9–10.3)
Chloride: 107 mmol/L (ref 98–111)
Creatinine, Ser: 0.52 mg/dL (ref 0.44–1.00)
GFR calc Af Amer: >60 mL/min (ref >60)
GFR calc non Af Amer: >60 mL/min (ref >60)
Glucose, Bld: 143 mg/dL — ABNORMAL HIGH (ref 70–99)
Potassium: 3.6 mmol/L (ref 3.5–5.1)
Sodium: 136 mmol/L (ref 135–145)
Total Bilirubin: 0.6 mg/dL (ref 0.3–1.2)
Total Protein: 6.4 g/dL — ABNORMAL LOW (ref 6.5–8.1)

## 2019-10-13 LAB — TROPONIN I (HIGH SENSITIVITY): Troponin I (High Sensitivity): 244 ng/L (ref <18)

## 2019-10-13 LAB — CBC WITH DIFFERENTIAL/PLATELET
Abs Immature Granulocytes: 0.06 10*3/uL (ref 0.00–0.07)
Basophils Absolute: 0 10*3/uL (ref 0.0–0.1)
Basophils Relative: 0 %
Eosinophils Absolute: 0 10*3/uL (ref 0.0–0.5)
Eosinophils Relative: 0 %
HCT: 44.8 % (ref 36.0–46.0)
Hemoglobin: 14.2 g/dL (ref 12.0–15.0)
Immature Granulocytes: 1 %
Lymphocytes Relative: 25 %
Lymphs Abs: 2 10*3/uL (ref 0.7–4.0)
MCH: 26.9 pg (ref 26.0–34.0)
MCHC: 31.7 g/dL (ref 30.0–36.0)
MCV: 84.8 fL (ref 80.0–100.0)
Monocytes Absolute: 1.1 10*3/uL — ABNORMAL HIGH (ref 0.1–1.0)
Monocytes Relative: 14 %
Neutro Abs: 4.8 10*3/uL (ref 1.7–7.7)
Neutrophils Relative %: 60 %
Platelets: 258 10*3/uL (ref 150–400)
RBC: 5.28 MIL/uL — ABNORMAL HIGH (ref 3.87–5.11)
RDW: 13 % (ref 11.5–15.5)
WBC: 8 10*3/uL (ref 4.0–10.5)
nRBC: 0 % (ref 0.0–0.2)

## 2019-10-13 LAB — FERRITIN: Ferritin: 268 ng/mL (ref 11–307)

## 2019-10-13 LAB — D-DIMER, QUANTITATIVE: D-Dimer, Quant: <0.27 ug/mL-FEU (ref 0.00–0.50)

## 2019-10-13 LAB — ABO/RH: ABO/RH(D): O POS

## 2019-10-13 LAB — PHOSPHORUS: Phosphorus: 3.4 mg/dL (ref 2.5–4.6)

## 2019-10-13 LAB — HEPARIN LEVEL (UNFRACTIONATED)
Heparin Unfractionated: 0.31 IU/mL (ref 0.30–0.70)
Heparin Unfractionated: 0.33 IU/mL (ref 0.30–0.70)

## 2019-10-13 LAB — CBG MONITORING, ED: Glucose-Capillary: 149 mg/dL — ABNORMAL HIGH (ref 70–99)

## 2019-10-13 LAB — HEMOGLOBIN A1C
Hgb A1c MFr Bld: 8 % — ABNORMAL HIGH (ref 4.8–5.6)
Mean Plasma Glucose: 182.9 mg/dL

## 2019-10-13 LAB — MAGNESIUM: Magnesium: 2 mg/dL (ref 1.7–2.4)

## 2019-10-13 LAB — C-REACTIVE PROTEIN: CRP: 3.9 mg/dL — ABNORMAL HIGH (ref <1.0)

## 2019-10-13 MED ORDER — ATORVASTATIN CALCIUM 40 MG PO TABS: 40.0000 mg | ORAL_TABLET | Freq: Every day | ORAL | 2 refills | Status: AC

## 2019-10-13 MED ORDER — DEXAMETHASONE 6 MG PO TABS: 6.0000 mg | ORAL_TABLET | Freq: Every day | ORAL | 0 refills | Status: AC

## 2019-10-13 MED ORDER — METOPROLOL TARTRATE 25 MG PO TABS: 25.0000 mg | ORAL_TABLET | Freq: Two times a day (BID) | ORAL | 2 refills | Status: DC

## 2019-10-13 MED ORDER — ATORVASTATIN CALCIUM 40 MG PO TABS: 40.0000 mg | ORAL_TABLET | Freq: Every day | ORAL | Status: DC

## 2019-10-13 MED ORDER — ASPIRIN 81 MG PO TBEC
81.0000 mg | DELAYED_RELEASE_TABLET | Freq: Every day | ORAL | 2 refills | Status: DC
Start: 2019-10-14 — End: 2021-07-23

## 2019-10-13 NOTE — ED Notes (Signed)
Report attempted x 1

## 2019-10-13 NOTE — Progress Notes (Signed)
Zio patch placed onto patient.  All instructions and information reviewed with patient, they verbalize understanding with no questions. 

## 2019-10-13 NOTE — Progress Notes (Signed)
Progress Note  Patient Name: [name] Date of Encounter: 10/13/2019  [facility] Cardiologist: No primary care provider on file.   Due to the COVID-19 pandemic, this visit was completed with telemedicine (audio) technology to reduce patient and provider exposure.  Subjective   Denies any chest pain  Inpatient Medications    Scheduled Meds: . amLODipine  5 mg Oral Daily  . vitamin C  500 mg Oral Daily  . aspirin EC  81 mg Oral Daily  . dexamethasone (DECADRON) injection  6 mg Intravenous Q24H  . insulin aspart  0-9 Units Subcutaneous Q4H  . insulin detemir  0.075 Units/kg Subcutaneous BID  . Ipratropium-Albuterol  1 puff Inhalation Q6H  . linagliptin  5 mg Oral Daily  . metoprolol tartrate  25 mg Oral BID  . simvastatin  20 mg Oral Daily  . sodium chloride flush  3 mL Intravenous Q12H  . sodium chloride flush  3 mL Intravenous Q12H  . zinc sulfate  220 mg Oral Daily   Continuous Infusions: . sodium chloride    . sodium chloride    . famotidine (PEPCID) IV    . heparin 1,650 Units/hr (10/13/19 1132)  . remdesivir 100 mg in NS 100 mL 100 mg (10/13/19 1054)   PRN Meds: sodium chloride, sodium chloride, acetaminophen, diphenhydrAMINE, EPINEPHrine, famotidine (PEPCID) IV, guaiFENesin-dextromethorphan, methylPREDNISolone (SOLU-MEDROL) injection, ondansetron **OR** ondansetron (ZOFRAN) IV, polyethylene glycol, senna-docusate, sodium chloride flush   Vital Signs    Vitals:   10/13/19 0100 10/13/19 0200 10/13/19 0513 10/13/19 0550  BP: 115/71 107/66 110/72 133/66  Pulse: 67 64 81 78  Resp: 20 18 14 16   Temp:   98.9 F (37.2 C) 98.3 F (36.8 C)  TempSrc:   Oral Oral  SpO2: 94% 94% 100% 97%  Weight:    100.8 kg  Height:    5\' 7"  (1.702 m)    Intake/Output Summary (Last 24 hours) at 10/13/2019 1151 Last data filed at 10/13/2019 0700 Gross per 24 hour  Intake 867.65 ml  Output --  Net 867.65 ml   Last 3 Weights 10/13/2019 10/11/2019 08/08/2017  Weight (lbs) 222 lb  3.6 oz 220 lb 257 lb 12.8 oz  Weight (kg) 100.8 kg 99.791 kg 116.937 kg      Telemetry    NSR rate 70s - Personally Reviewed  ECG    No new ECG - Personally Reviewed  Physical Exam   GEN: No acute distress.   RESP: able to converse comfortably, no respiratory distress  Labs    High Sensitivity Troponin:   Recent Labs  Lab 10/11/19 2219 10/12/19 0243 10/12/19 0920 10/12/19 1215 10/13/19 0511  TROPONINIHS 251* 283* 348* 434* 244*      Chemistry Recent Labs  Lab 10/11/19 1532 10/12/19 0403 10/13/19 0412  NA 137 141 136  K 3.7 3.4* 3.6  CL 102 106 107  CO2 19* 25 21*  GLUCOSE 299* 154* 143*  BUN 14 12 18   CREATININE 0.76 0.58 0.52  CALCIUM 9.3 8.8* 8.5*  PROT 7.8 6.7 6.4*  ALBUMIN 4.0 3.4* 3.2*  AST 21 18 22   ALT 22 20 24   ALKPHOS 70 57 50  BILITOT 0.7 0.6 0.6  GFRNONAA >60 >60 >60  GFRAA >60 >60 >60  ANIONGAP 16* 10 8     Hematology Recent Labs  Lab 10/11/19 2219 10/12/19 0403 10/13/19 0412  WBC 8.3 7.6 8.0  RBC 5.38* 5.18* 5.28*  HGB 14.4 14.5 14.2  HCT 44.4 43.9 44.8  MCV 82.5  84.7 84.8  MCH 26.8 28.0 26.9  MCHC 32.4 33.0 31.7  RDW 12.9 12.9 13.0  PLT 303 261 258    BNP Recent Labs  Lab 10/11/19 1532  BNP 136.7*     DDimer  Recent Labs  Lab 10/11/19 2219 10/12/19 0403 10/13/19 0412  DDIMER <0.27 <0.27 <0.27     Radiology    CT Angio Chest PE W and/or Wo Contrast  Result Date: 10/11/2019 CLINICAL DATA:  PE suspected, high prob Acute onset of chest pain while mourning husband's passing, has been with COVID positive. EXAM: CT ANGIOGRAPHY CHEST WITH CONTRAST TECHNIQUE: Multidetector CT imaging of the chest was performed using the standard protocol during bolus administration of intravenous contrast. Multiplanar CT image reconstructions and MIPs were obtained to evaluate the vascular anatomy. CONTRAST:  146mL OMNIPAQUE IOHEXOL 350 MG/ML SOLN Initial contrast bolus was suboptimal, repeat injection with improved pulmonary artery  opacification. COMPARISON:  Radiograph earlier today. FINDINGS: Cardiovascular: There are no filling defects within the pulmonary arteries to suggest pulmonary embolus. No aortic dissection. Mild aortic atherosclerosis. Ascending aortic ectasia at 4 cm, measured on series 9, image 62. Upper normal heart size. There are coronary artery calcifications. No pericardial effusion. Mediastinum/Nodes: Enlarged 15 mm right hilar node, likely reactive. Small mediastinal lymph nodes are not enlarged by size criteria. Esophagus slightly patulous. There is no esophageal wall thickening. Lungs/Pleura: Heterogeneous pulmonary parenchyma with geographic areas of interspersed ground-glass opacity. Mild central bronchial thickening. No pleural fluid. Trachea and central bronchi are patent. Upper Abdomen: No acute findings.  Cholecystectomy. Musculoskeletal: There are no acute or suspicious osseous abnormalities. Review of the MIP images confirms the above findings. IMPRESSION: 1. No pulmonary embolus. 2. Heterogeneous pulmonary parenchyma with geographic areas of interspersed ground-glass opacity. Findings likely represent a combination of small airways disease and COVID-19 pneumonia. 3. Mild central bronchial thickening. 4. Ectatic ascending aorta at 4 cm. Recommend annual imaging followup by CTA or MRA. This recommendation follows 2010 ACCF/AHA/AATS/ACR/ASA/SCA/SCAI/SIR/STS/SVM Guidelines for the Diagnosis and Management of Patients with Thoracic Aortic Disease. Circulation. 2010; 121: A630-Z601. Aortic aneurysm NOS (ICD10-I71.9) Aortic Atherosclerosis (ICD10-I70.0). Electronically Signed   By: Keith Rake M.D.   On: 10/11/2019 18:08   DG Chest Portable 1 View  Result Date: 10/11/2019 CLINICAL DATA:  Dyspnea EXAM: PORTABLE CHEST 1 VIEW COMPARISON:  10/08/2019 FINDINGS: The heart size and mediastinal contours are within normal limits. Both lungs are clear. The visualized skeletal structures are unremarkable. IMPRESSION: No  active disease. Electronically Signed   By: Fidela Salisbury MD   On: 10/11/2019 16:13   ECHOCARDIOGRAM LIMITED  Result Date: 10/12/2019    ECHOCARDIOGRAM LIMITED REPORT   Patient Name:   [name] Date of Exam: 10/12/2019 Medical Rec #:  [id number]     Height:       67.0 in Accession #:    [id number]    Weight:       220.0 lb Date of Birth:  1965-05-24     BSA:          2.106 m Patient Age:    13 years      BP:           108/84 mmHg Patient Gender: F             HR:           72 bpm. Exam Location:  Inpatient Procedure: Limited Echo, Cardiac Doppler and Limited Color Doppler Indications:    NSTEMI I21.4  History:  Patient has no prior history of Echocardiogram examinations.                 Risk Factors:Diabetes, Dyslipidemia and Hypertension.  Sonographer:    Bernadene Person RDCS Referring Phys: [place]  1. Left ventricular ejection fraction, by estimation, is 60 to 65%. The left ventricle has normal function. The left ventricle has no regional wall motion abnormalities. Left ventricular diastolic parameters are consistent with Grade I diastolic dysfunction (impaired relaxation).  2. Right ventricular systolic function is normal. The right ventricular size is normal. Tricuspid regurgitation signal is inadequate for assessing PA pressure.  3. The mitral valve is normal in structure. No evidence of mitral valve regurgitation. No evidence of mitral stenosis.  4. The aortic valve is normal in structure. Aortic valve regurgitation is not visualized. No aortic stenosis is present.  5. The inferior vena cava is normal in size with greater than 50% respiratory variability, suggesting right atrial pressure of 3 mmHg. FINDINGS  Left Ventricle: Left ventricular ejection fraction, by estimation, is 60 to 65%. The left ventricle has normal function. The left ventricle has no regional wall motion abnormalities. The left ventricular internal cavity size was normal in size. There is  no left  ventricular hypertrophy. Normal left ventricular filling pressure. Right Ventricle: The right ventricular size is normal. No increase in right ventricular wall thickness. Right ventricular systolic function is normal. Tricuspid regurgitation signal is inadequate for assessing PA pressure. Left Atrium: Left atrial size was normal in size. Right Atrium: Right atrial size was normal in size. Pericardium: There is no evidence of pericardial effusion. Mitral Valve: The mitral valve is normal in structure. Normal mobility of the mitral valve leaflets. No evidence of mitral valve stenosis. Tricuspid Valve: The tricuspid valve is normal in structure. Tricuspid valve regurgitation is not demonstrated. No evidence of tricuspid stenosis. Aortic Valve: The aortic valve is normal in structure. Aortic valve regurgitation is not visualized. No aortic stenosis is present. Pulmonic Valve: The pulmonic valve was normal in structure. Pulmonic valve regurgitation is trivial. No evidence of pulmonic stenosis. Aorta: The aortic root is normal in size and structure. Venous: The inferior vena cava was not well visualized. The inferior vena cava is normal in size with greater than 50% respiratory variability, suggesting right atrial pressure of 3 mmHg. IAS/Shunts: No atrial level shunt detected by color flow Doppler. LEFT VENTRICLE PLAX 2D LVIDd:         4.10 cm  Diastology LVIDs:         2.10 cm  LV e' lateral:   5.92 cm/s LV PW:         1.10 cm  LV E/e' lateral: 13.1 LV IVS:        1.00 cm  LV e' medial:    6.29 cm/s LVOT diam:     2.00 cm  LV E/e' medial:  12.4 LV SV:         68 LV SV Index:   32 LVOT Area:     3.14 cm  RIGHT VENTRICLE RV S prime:     11.50 cm/s TAPSE (M-mode): 1.8 cm LEFT ATRIUM             Index       RIGHT ATRIUM           Index LA diam:        3.30 cm 1.57 cm/m  RA Area:     12.50 cm LA Vol (A2C):   23.6 ml 11.21  ml/m RA Volume:   22.60 ml  10.73 ml/m LA Vol (A4C):   42.2 ml 20.04 ml/m LA Biplane Vol: 33.6 ml  15.96 ml/m  AORTIC VALVE LVOT Vmax:   79.20 cm/s LVOT Vmean:  54.500 cm/s LVOT VTI:    0.216 m  AORTA Ao Root diam: 3.10 cm MITRAL VALVE MV Area (PHT): 2.73 cm     SHUNTS MV Decel Time: 278 msec     Systemic VTI:  0.22 m MV E velocity: 77.70 cm/s   Systemic Diam: 2.00 cm MV A velocity: 102.00 cm/s MV E/A ratio:  0.76 Fransico Him MD Electronically signed by Fransico Him MD Signature Date/Time: 10/12/2019/2:06:40 PM    Final     Cardiac Studies   Echo 10/12/19: 1. Left ventricular ejection fraction, by estimation, is 60 to 65%. The  left ventricle has normal function. The left ventricle has no regional  wall motion abnormalities. Left ventricular diastolic parameters are  consistent with Grade I diastolic  dysfunction (impaired relaxation).  2. Right ventricular systolic function is normal. The right ventricular  size is normal. Tricuspid regurgitation signal is inadequate for assessing  PA pressure.  3. The mitral valve is normal in structure. No evidence of mitral valve  regurgitation. No evidence of mitral stenosis.  4. The aortic valve is normal in structure. Aortic valve regurgitation is  not visualized. No aortic stenosis is present.  5. The inferior vena cava is normal in size with greater than 50%  respiratory variability, suggesting right atrial pressure of 3 mmHg.   Patient Profile     54 y.o. female hx of HTN, IDDM, HLD, obesity, migraines, former tobacco use (15 years, quit 1998) and family history of premature CAD who presents with troponin elevation  Assessment & Plan    Palpitations, chest pain, dyspnea with mildly elevated troponin: initial symptoms are suspicious for transient tachyarrhythmia, suspected induced by emotional stress - not captured on any available strips.  Troponin peak 434.  Echo showed no wall motion abnormality.  Currently chest pain free.  Does have coronary calcifications on CT chest. -Suspect demand ischemia in setting of COVID-19 pneumonia and  possible tachyarrythmia.  Can stop IV heparin.  No further cardiac work-up recommended as inpatient.  Will plan outpatient f/u and consideration of stress test once recovers from [place].  Can continue ASA and beta blocker   Covid-19 infection: received mab in ED, also getting remdesivir, decadron, zinc.  D-dimer negative and CTA negative for PE  Essential HTN: continue home amlodipine  Hyperlipidemia - LDL noted to be 90, recommend changing simvastatin to atorvastatin 40 given goal LDL <70 in context of diabetes and coronary calcifications on CTA  Ectatic ascending aorta by CT - annual f/u imaging suggested in 10/2020 - BP control  [facility] will sign off.   Medication Recommendations:  Added aspirin 81 mg daily, metoprolol 25 mg BID.  Switch from simvastatin to atorvastatin. Other recommendations (labs, testing, etc):  Zio monitor x 2 weeks Follow up as an outpatient:  Will schedule follow-up   For questions or updates, please contact [employer] Please consult [email or web address] for contact info under        Signed, Donato Heinz, MD  10/13/2019, 11:51 AM

## 2019-10-13 NOTE — TOC Transition Note (Signed)
Transition of Care (TOC) - CM/SW Discharge Note Marvetta Gibbons RN, BSN Transitions of Care Unit 4E- RN Case Manager See Treatment Team for direct phone #    Patient Details  Name: [name] MRN: [id number] Date of Birth: 29-Jun-1965  Transition of Care [facility]) CM/SW Contact:  Dawayne Patricia, RN Phone Number: 10/13/2019, 1:59 PM   Clinical Narrative:    Pt from home +COVID on 8/31- admitted with c/p following husband's passing. Pt will continue with outpt infusion center for remaining IV Remdesivir and also outpt cardiac follow up. Pt has PCP and Cardiac f/u in place. No TOC needs noted for transition home.    Final next level of care: Home/Self Care Barriers to Discharge: No Barriers Identified   Patient Goals and CMS Choice Patient states their goals for this hospitalization and ongoing recovery are:: return home   Choice offered to / list presented to : NA  Discharge Placement                 Home      Discharge Plan and Services     Post Acute Care Choice: NA          DME Arranged: N/A DME Agency: NA       HH Arranged: NA HH Agency: NA        Social Determinants of Health (SDOH) Interventions     Readmission Risk Interventions Readmission Risk Prevention Plan 10/13/2019  Transportation Screening Complete  PCP or Specialist Appt within 5-7 Days Complete  Home Care Screening Complete  Medication Review (RN CM) Complete  Some recent data might be hidden

## 2019-10-13 NOTE — Plan of Care (Signed)

## 2019-10-13 NOTE — Discharge Summary (Addendum)
Physician Discharge Summary  [name] KDT:[id number] DOB: Apr 06, 1965 DOA: 10/11/2019  PCP: Leeanne Rio, MD  Admit date: 10/11/2019 Discharge date: 10/13/2019  Admitted From: Home Disposition: Home   Recommendations for Outpatient Follow-up:  1. Follow up with PCP in 1-2 weeks 2. Please obtain BMP/CBC in one week 3. Encouraged to get vaccinated after 90 days from monoclonal antibody infusion 4. Follow up with cardiology for continued evaluation of coronary artery disease.  1. 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 She was driving when her husband suffered a cardiac arrest. A prolonged resuscitation effort was unsuccessful. He was brought to the ED and pronounced deceased. While she was spending time with him saying her goodbyes she suddenly began feeling her heart racing with associated chest pressure, dizziness and SOB so notified staff. She reports when placed on monitor HR was over 180. She was evaluated, was found to have elevated troponin. Initial EKG showed sinus tachycardia 107bpm, old anterolateral infarct with Q waves I, avL, and V1-V3 (the lateral Q waves were not seen on previous tracings or follow-up EKG so suspect lead placement issue in these leads). CTA showed no PE but did show findings consistent with small airspace disease and Covid-19 PNA, mild central bronchial thickening, ectatic ascending aorta  at 4cm. No CXR infiltrates. She was started on IV heparin, aspirin, and metoprolol with cardiology consulting for NSTEMI. She also received monoclonal antibody infusion and subsequently was admitted, started on remdesivir and steroids with improvement. Echocardiogram shows no focal wall motion abnormalities and cardiology has cleared her for discharge after >48 hours heparin, downtrending troponin, and durable resolution in chest pain. She will continue remdesivir infusions following discharge and complete 5 more days of steroids. Discharged without hypoxia.  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 You have completed a course of heparin and are cleared for discharge by cardiology. You are felt to be stable enough to no longer require inpatient monitoring, testing, and treatment, though you will need to follow the recommendations below: - Continue taking decadron 6mg  daily for 5 more days - Continue taking remdesivir at the infusion center tomorrow and Friday. Appointments will be made for you prior to discharge and information provided for how to get there. - You likely do have coronary artery disease, though no further evaluation is necessary at this time. Cardiology will arrange follow up with you and recommends the following:  --- Start taking aspirin 81mg  daily --- Start taking metoprolol 25mg  twice daily --- Change  simvastatin to a higher potency atorvastatin - Per CDC guidelines, you will need to remain in isolation for 21  days from your first positive covid test. - Follow up with your doctor in the next week via telehealth or seek medical attention right away if your symptoms get WORSE.  - You are encouraged to get the covid vaccine, but will need to delay for 90 days from the day of your monoclonal antibody infusion.   Directions for you at home:  Wear a facemask You should wear a facemask that covers your nose and mouth when you are in the same room with other people and when you visit a healthcare provider. People who live with or visit you should also wear a facemask while they are in the same room with you.  Separate yourself from other people in your home As much as possible, you should stay in a different room from other people in your home. Also, you should use a separate bathroom, if available.  Avoid sharing household items You should not share dishes, drinking glasses, cups, eating utensils, towels, bedding, or other items with other people in your home. After using these items, you should wash them thoroughly with soap and water.  Cover your coughs and sneezes Cover your mouth and nose with a tissue when you cough or sneeze, or you can cough or sneeze into your sleeve. Throw used tissues in a lined trash can, and immediately wash your hands with soap and water for at least 20 seconds or use an alcohol-based hand rub.  Wash your [facility] your hands often and thoroughly with soap and water for at least 20 seconds. You can use an alcohol-based hand sanitizer if soap and water are not available and if your hands are not visibly dirty. Avoid touching your eyes, nose, and mouth with unwashed hands.  Directions for those who live with, or provide care at home for you:  Limit the number of people who have contact with the patient If possible, have only one caregiver for the patient. Other household members should stay in another home or place of residence. If this is not possible, they should  stay in another room, or be separated from the patient as much as possible. Use a separate bathroom, if available. Restrict visitors who do not have an essential need to be in the home.  Ensure good ventilation Make sure that shared spaces in the home have good air flow, such as from an air conditioner or an opened window, weather permitting.  Wash your hands often Wash your hands often and thoroughly with soap and water for at least 20 seconds. You can use an alcohol based hand sanitizer if soap and water are not available and if your hands are not visibly dirty. Avoid touching your eyes, nose, and mouth with unwashed hands. Use disposable paper towels to dry your hands. If not available, use dedicated cloth towels and replace them when they become wet.  Wear a facemask and gloves Wear a disposable facemask at all times in the room and gloves when you touch or have contact with the patient's blood, body fluids, and/or secretions or excretions, such as sweat, saliva, sputum, nasal mucus, vomit, urine, or feces.  Ensure the mask fits over your nose and mouth tightly, and do not touch it during use. Throw out disposable facemasks and gloves after using them. Do not reuse. Wash your hands immediately after removing your facemask and gloves. If your personal clothing becomes contaminated,  carefully remove clothing and launder. Wash your hands after handling contaminated clothing. Place all used disposable facemasks, gloves, and other waste in a lined container before disposing them with other household waste. Remove gloves and wash your hands immediately after handling these items.  Do not share dishes, glasses, or other household items with the patient Avoid sharing household items. You should not share dishes, drinking glasses, cups, eating utensils, towels, bedding, or other items with a patient who is confirmed to have, or being evaluated for, COVID-19 infection. After the person uses these  items, you should wash them thoroughly with soap and water.  Wash laundry thoroughly Immediately remove and wash clothes or bedding that have blood, body fluids, and/or secretions or excretions, such as sweat, saliva, sputum, nasal mucus, vomit, urine, or feces, on them. Wear gloves when handling laundry from the patient. Read and follow directions on labels of laundry or clothing items and detergent. In general, wash and dry with the warmest temperatures recommended on the label.  Clean all areas the individual has used often Clean all touchable surfaces, such as counters, tabletops, doorknobs, bathroom fixtures, toilets, phones, keyboards, tablets, and bedside tables, every day. Also, clean any surfaces that may have blood, body fluids, and/or secretions or excretions on them. Wear gloves when cleaning surfaces the patient has come in contact with. Use a diluted bleach solution (e.g., dilute bleach with 1 part bleach and 10 parts water) or a household disinfectant with a label that says EPA-registered for coronaviruses. To make a bleach solution at home, add 1 tablespoon of bleach to 1 quart (4 cups) of water. For a larger supply, add  cup of bleach to 1 gallon (16 cups) of water. Read labels of cleaning products and follow recommendations provided on product labels. Labels contain instructions for safe and effective use of the cleaning product including precautions you should take when applying the product, such as wearing gloves or eye protection and making sure you have good ventilation during use of the product. Remove gloves and wash hands immediately after cleaning.  Monitor yourself for signs and symptoms of illness Caregivers and household members are considered close contacts, should monitor their health, and will be asked to limit movement outside of the home to the extent possible. Follow the monitoring steps for close contacts listed on the symptom monitoring form.  If you have  additional questions, contact your local health department or call the epidemiologist on call at [phone] (available 24/7). This guidance is subject to change. For the most up-to-date guidance from [facility], please refer to their website: YouBlogs.pl   Increase activity slowly   Complete by: As directed    MyChart COVID-19 home monitoring program   Complete by: Oct 13, 2019    Is the patient willing to use the [facility] for home monitoring?: Yes     Allergies as of 10/13/2019      Reactions   Azo [phenazopyridine] Hives, Swelling   FACIAL SWELLING, CHEST TIGHTNESS   Morphine And Related Nausea And Vomiting, Other (See Comments)   DIZZINESS   Opium Itching, Nausea And Vomiting   OPIODS/NARCOTICS - ITCHING OF NOSE, DIZZINESS   Sulfa Antibiotics Rash      Medication List    STOP taking these medications   azithromycin 250 MG tablet Commonly known as: ZITHROMAX   hydrochlorothiazide 12.5 MG tablet Commonly known as: HYDRODIURIL   simvastatin 20 MG tablet Commonly known as: ZOCOR     TAKE these medications   albuterol 108 (  90 Base) MCG/ACT inhaler Commonly known as: VENTOLIN HFA Inhale 2 puffs into the lungs every 4 (four) hours as needed for shortness of breath.   amLODipine 5 MG tablet Commonly known as: NORVASC Take 5 mg by mouth daily.   aspirin 81 MG EC tablet Take 1 tablet (81 mg total) by mouth daily. Swallow whole. Start taking on: October 14, 2019   atorvastatin 40 MG tablet Commonly known as: LIPITOR Take 1 tablet (40 mg total) by mouth daily.   dexamethasone 6 MG tablet Commonly known as: Decadron Take 1 tablet (6 mg total) by mouth daily for 5 days. Start taking on: October 14, 2019 What changed: medication strength   fluticasone 50 MCG/ACT nasal spray Commonly known as: FLONASE Place 1 spray into both nostrils daily as needed for allergies.   insulin regular 100 units/mL  injection Commonly known as: NOVOLIN R Inject 2-8 Units into the skin 3 (three) times daily before meals. Per sliding scale : not provided   Jardiance 25 MG Tabs tablet Generic drug: empagliflozin TAKE 25 MG BY MOUTH DAILY. What changed:   how much to take  how to take this  when to take this   meclizine 25 MG tablet Commonly known as: ANTIVERT Take 25 mg by mouth 3 (three) times daily as needed for dizziness.   metFORMIN 1000 MG tablet Commonly known as: GLUCOPHAGE Take 1 tablet (1,000 mg total) by mouth 2 (two) times daily with a meal.   metoprolol tartrate 25 MG tablet Commonly known as: LOPRESSOR Take 1 tablet (25 mg total) by mouth 2 (two) times daily. What changed:   medication strength  how much to take  how to take this  when to take this   SUMAtriptan 50 MG tablet Commonly known as: Imitrex Take 1 tablet (50 mg total) by mouth every 2 (two) hours as needed for migraine. May repeat in 2 hours if headache persists or recurs.   valsartan-hydrochlorothiazide 320-12.5 MG tablet Commonly known as: DIOVAN-HCT Take 1 tablet by mouth daily.       Follow-up Information    Leeanne Rio, MD. Schedule an appointment as soon as possible for a visit in 1 week(s).   Specialty: Family Medicine Contact information: [place] [address] [phone]              Allergies  Allergen Reactions  . Azo [Phenazopyridine] Hives and Swelling    FACIAL SWELLING, CHEST TIGHTNESS  . Morphine And Related Nausea And Vomiting and Other (See Comments)    DIZZINESS  . Opium Itching and Nausea And Vomiting    OPIODS/NARCOTICS - ITCHING OF NOSE, DIZZINESS  . Sulfa Antibiotics Rash    Consultations:  Cardiology  Procedures/Studies: CT Angio Chest PE W and/or Wo Contrast  Result Date: 10/11/2019 CLINICAL DATA:  PE suspected, high prob Acute onset of chest pain while mourning husband's passing, has been with COVID positive. EXAM: CT ANGIOGRAPHY  CHEST WITH CONTRAST TECHNIQUE: Multidetector CT imaging of the chest was performed using the standard protocol during bolus administration of intravenous contrast. Multiplanar CT image reconstructions and MIPs were obtained to evaluate the vascular anatomy. CONTRAST:  127mL OMNIPAQUE IOHEXOL 350 MG/ML SOLN Initial contrast bolus was suboptimal, repeat injection with improved pulmonary artery opacification. COMPARISON:  Radiograph earlier today. FINDINGS: Cardiovascular: There are no filling defects within the pulmonary arteries to suggest pulmonary embolus. No aortic dissection. Mild aortic atherosclerosis. Ascending aortic ectasia at 4 cm, measured on series 9, image 62. Upper normal  heart size. There are coronary artery calcifications. No pericardial effusion. Mediastinum/Nodes: Enlarged 15 mm right hilar node, likely reactive. Small mediastinal lymph nodes are not enlarged by size criteria. Esophagus slightly patulous. There is no esophageal wall thickening. Lungs/Pleura: Heterogeneous pulmonary parenchyma with geographic areas of interspersed ground-glass opacity. Mild central bronchial thickening. No pleural fluid. Trachea and central bronchi are patent. Upper Abdomen: No acute findings.  Cholecystectomy. Musculoskeletal: There are no acute or suspicious osseous abnormalities. Review of the MIP images confirms the above findings. IMPRESSION: 1. No pulmonary embolus. 2. Heterogeneous pulmonary parenchyma with geographic areas of interspersed ground-glass opacity. Findings likely represent a combination of small airways disease and COVID-19 pneumonia. 3. Mild central bronchial thickening. 4. Ectatic ascending aorta at 4 cm. Recommend annual imaging followup by CTA or MRA. This recommendation follows 2010 ACCF/AHA/AATS/ACR/ASA/SCA/SCAI/SIR/STS/SVM Guidelines for the Diagnosis and Management of Patients with Thoracic Aortic Disease. Circulation. 2010; 121: Z610-R604. Aortic aneurysm NOS (ICD10-I71.9) Aortic  Atherosclerosis (ICD10-I70.0). Electronically Signed   By: Keith Rake M.D.   On: 10/11/2019 18:08   DG Chest Portable 1 View  Result Date: 10/11/2019 CLINICAL DATA:  Dyspnea EXAM: PORTABLE CHEST 1 VIEW COMPARISON:  10/08/2019 FINDINGS: The heart size and mediastinal contours are within normal limits. Both lungs are clear. The visualized skeletal structures are unremarkable. IMPRESSION: No active disease. Electronically Signed   By: Fidela Salisbury MD   On: 10/11/2019 16:13   ECHOCARDIOGRAM LIMITED  Result Date: 10/12/2019    ECHOCARDIOGRAM LIMITED REPORT   Patient Name:   [name] Date of Exam: 10/12/2019 Medical Rec #:  [id number]     Height:       67.0 in Accession #:    [id number]    Weight:       220.0 lb Date of Birth:  1965-11-04     BSA:          2.106 m Patient Age:    54 years      BP:           108/84 mmHg Patient Gender: F             HR:           72 bpm. Exam Location:  Inpatient Procedure: Limited Echo, Cardiac Doppler and Limited Color Doppler Indications:    NSTEMI I21.4  History:        Patient has no prior history of Echocardiogram examinations.                 Risk Factors:Diabetes, Dyslipidemia and Hypertension.  Sonographer:    Bernadene Person RDCS Referring Phys: Sharon  1. Left ventricular ejection fraction, by estimation, is 60 to 65%. The left ventricle has normal function. The left ventricle has no regional wall motion abnormalities. Left ventricular diastolic parameters are consistent with Grade I diastolic dysfunction (impaired relaxation).  2. Right ventricular systolic function is normal. The right ventricular size is normal. Tricuspid regurgitation signal is inadequate for assessing PA pressure.  3. The mitral valve is normal in structure. No evidence of mitral valve regurgitation. No evidence of mitral stenosis.  4. The aortic valve is normal in structure. Aortic valve regurgitation is not visualized. No aortic stenosis is present.  5. The  inferior vena cava is normal in size with greater than 50% respiratory variability, suggesting right atrial pressure of 3 mmHg. FINDINGS  Left Ventricle: Left ventricular ejection fraction, by estimation, is 60 to 65%. The left ventricle has normal function. The left ventricle has no  regional wall motion abnormalities. The left ventricular internal cavity size was normal in size. There is  no left ventricular hypertrophy. Normal left ventricular filling pressure. Right Ventricle: The right ventricular size is normal. No increase in right ventricular wall thickness. Right ventricular systolic function is normal. Tricuspid regurgitation signal is inadequate for assessing PA pressure. Left Atrium: Left atrial size was normal in size. Right Atrium: Right atrial size was normal in size. Pericardium: There is no evidence of pericardial effusion. Mitral Valve: The mitral valve is normal in structure. Normal mobility of the mitral valve leaflets. No evidence of mitral valve stenosis. Tricuspid Valve: The tricuspid valve is normal in structure. Tricuspid valve regurgitation is not demonstrated. No evidence of tricuspid stenosis. Aortic Valve: The aortic valve is normal in structure. Aortic valve regurgitation is not visualized. No aortic stenosis is present. Pulmonic Valve: The pulmonic valve was normal in structure. Pulmonic valve regurgitation is trivial. No evidence of pulmonic stenosis. Aorta: The aortic root is normal in size and structure. Venous: The inferior vena cava was not well visualized. The inferior vena cava is normal in size with greater than 50% respiratory variability, suggesting right atrial pressure of 3 mmHg. IAS/Shunts: No atrial level shunt detected by color flow Doppler. LEFT VENTRICLE PLAX 2D LVIDd:         4.10 cm  Diastology LVIDs:         2.10 cm  LV e' lateral:   5.92 cm/s LV PW:         1.10 cm  LV E/e' lateral: 13.1 LV IVS:        1.00 cm  LV e' medial:    6.29 cm/s LVOT diam:     2.00 cm  LV  E/e' medial:  12.4 LV SV:         68 LV SV Index:   32 LVOT Area:     3.14 cm  RIGHT VENTRICLE RV S prime:     11.50 cm/s TAPSE (M-mode): 1.8 cm LEFT ATRIUM             Index       RIGHT ATRIUM           Index LA diam:        3.30 cm 1.57 cm/m  RA Area:     12.50 cm LA Vol (A2C):   23.6 ml 11.21 ml/m RA Volume:   22.60 ml  10.73 ml/m LA Vol (A4C):   42.2 ml 20.04 ml/m LA Biplane Vol: 33.6 ml 15.96 ml/m  AORTIC VALVE LVOT Vmax:   79.20 cm/s LVOT Vmean:  54.500 cm/s LVOT VTI:    0.216 m  AORTA Ao Root diam: 3.10 cm MITRAL VALVE MV Area (PHT): 2.73 cm     SHUNTS MV Decel Time: 278 msec     Systemic VTI:  0.22 m MV E velocity: 77.70 cm/s   Systemic Diam: 2.00 cm MV A velocity: 102.00 cm/s MV E/A ratio:  0.76 Fransico Him MD Electronically signed by Fransico Him MD Signature Date/Time: 10/12/2019/2:06:40 PM    Final       Subjective: Feels no chest pain or significant shortness of breath. Feels ready to go home.   Discharge Exam: Vitals:   10/13/19 0513 10/13/19 0550  BP: 110/72 133/66  Pulse: 81 78  Resp: 14 16  Temp: 98.9 F (37.2 C) 98.3 F (36.8 C)  SpO2: 100% 97%   General: Pt is alert, awake, not in acute distress Cardiovascular: RRR, S1/S2 +, no rubs, no  gallops Respiratory: CTA bilaterally, no wheezing, no rhonchi Abdominal: Soft, NT, ND, bowel sounds + Extremities: No edema, no cyanosis  Labs: BNP (last 3 results) Recent Labs    10/11/19 1532  BNP 256.3*   Basic Metabolic Panel: Recent Labs  Lab 10/11/19 1532 10/12/19 0403 10/13/19 0412  NA 137 141 136  K 3.7 3.4* 3.6  CL 102 106 107  CO2 19* 25 21*  GLUCOSE 299* 154* 143*  BUN 14 12 18   CREATININE 0.76 0.58 0.52  CALCIUM 9.3 8.8* 8.5*  MG 1.8 1.9 2.0  PHOS  --  3.8 3.4   Liver Function Tests: Recent Labs  Lab 10/11/19 1532 10/12/19 0403 10/13/19 0412  AST 21 18 22   ALT 22 20 24   ALKPHOS 70 57 50  BILITOT 0.7 0.6 0.6  PROT 7.8 6.7 6.4*  ALBUMIN 4.0 3.4* 3.2*   No results for input(s): LIPASE,  AMYLASE in the last 168 hours. No results for input(s): AMMONIA in the last 168 hours. CBC: Recent Labs  Lab 10/11/19 1532 10/11/19 2219 10/12/19 0403 10/13/19 0412  WBC 9.7 8.3 7.6 8.0  NEUTROABS  --  6.0 4.8 4.8  HGB 15.2* 14.4 14.5 14.2  HCT 46.6* 44.4 43.9 44.8  MCV 83.1 82.5 84.7 84.8  PLT 263 303 261 258   Cardiac Enzymes: No results for input(s): CKTOTAL, CKMB, CKMBINDEX, TROPONINI in the last 168 hours. BNP: Invalid input(s): POCBNP CBG: Recent Labs  Lab 10/12/19 1847 10/12/19 1950 10/12/19 2328 10/13/19 0409 10/13/19 0557  GLUCAP 177* 216* 149* 149* 224*   D-Dimer Recent Labs    10/12/19 0403 10/13/19 0412  DDIMER <0.27 <0.27   Hgb A1c Recent Labs    10/13/19 0418  HGBA1C 8.0*   Lipid Profile Recent Labs    10/12/19 0243  CHOL 149  HDL 23*  LDLCALC 90  TRIG 181*  CHOLHDL 6.5   Thyroid function studies Recent Labs    10/12/19 1215  TSH 1.391   Anemia work up Recent Labs    10/12/19 0403 10/13/19 0412  FERRITIN 218 268   Urinalysis    Component Value Date/Time   COLORURINE YELLOW 01/19/2010 1927   APPEARANCEUR CLEAR 01/19/2010 1927   LABSPEC 1.010 01/19/2010 1927   PHURINE 6.0 01/19/2010 1927   GLUCOSEU NEGATIVE 01/19/2010 1927   HGBUR NEGATIVE 01/19/2010 1927   BILIRUBINUR NEGATIVE 01/19/2010 [place] NEGATIVE 01/19/2010 1927   PROTEINUR NEGATIVE 01/19/2010 1927   UROBILINOGEN 0.2 01/19/2010 1927   NITRITE NEGATIVE 01/19/2010 1927   LEUKOCYTESUR  01/19/2010 1927    NEGATIVE MICROSCOPIC NOT DONE ON URINES WITH NEGATIVE PROTEIN, BLOOD, LEUKOCYTES, NITRITE, OR GLUCOSE <1000 mg/dL.    Microbiology Recent Results (from the past 240 hour(s))  Culture, blood (Routine X 2) w Reflex to ID Panel     Status: None (Preliminary result)   Collection Time: 10/11/19 10:28 PM   Specimen: BLOOD  Result Value Ref Range Status   Specimen Description BLOOD LEFT ANTECUBITAL  Final   Special Requests   Final    BOTTLES DRAWN AEROBIC  AND ANAEROBIC Blood Culture adequate volume   Culture   Final    NO GROWTH 2 DAYS Performed at [facility], [address], [place], [place] [address]    Report Status PENDING  Incomplete  Culture, blood (Routine X 2) w Reflex to ID Panel     Status: None (Preliminary result)   Collection Time: 10/11/19 10:39 PM   Specimen: BLOOD  Result Value Ref Range  Status   Specimen Description BLOOD RIGHT ANTECUBITAL  Final   Special Requests   Final    BOTTLES DRAWN AEROBIC AND ANAEROBIC Blood Culture results may not be optimal due to an inadequate volume of blood received in culture bottles   Culture   Final    NO GROWTH 2 DAYS Performed at [facility], [place] [address], [place], [place] [address]    Report Status PENDING  Incomplete  SARS Coronavirus 2 by RT PCR (hospital order, performed in [facility] hospital lab) Nasopharyngeal Nasopharyngeal Swab     Status: Abnormal   Collection Time: 10/11/19 11:28 PM   Specimen: Nasopharyngeal Swab  Result Value Ref Range Status   SARS Coronavirus 2 POSITIVE (A) NEGATIVE Final    Comment: RESULT CALLED TO, READ BACK BY AND VERIFIED WITH: RN SETRAN K. AT [facility] ON 10/12/2019 (NOTE) SARS-CoV-2 target nucleic acids are DETECTED  SARS-CoV-2 RNA is generally detectable in upper respiratory specimens  during the acute phase of infection.  Positive results are indicative  of the presence of the identified virus, but do not rule out bacterial infection or co-infection with other pathogens not detected by the test.  Clinical correlation with patient history and  other diagnostic information is necessary to determine patient infection status.  The expected result is negative.  Fact Sheet for Patients:   StrictlyIdeas.no   Fact Sheet for Healthcare Providers:   BankingDealers.co.za    This test is not yet approved or cleared by the [place] FDA and  has been authorized for  detection and/or diagnosis of SARS-CoV-2 by FDA under an Emergency Use Authorization (EUA).  This EUA will remain in effect (mean ing this test can be used) for the duration of  the COVID-19 declaration under Section 564(b)(1) of the Act, 21 U.S.C. section 360-bbb-3(b)(1), unless the authorization is terminated or revoked sooner.  Performed at [facility], [place] [address], [place], [place] [address]     Time coordinating discharge: Approximately 40 minutes  Patrecia Pour, MD  [facility] 10/13/2019, 12:36 PM

## 2019-10-13 NOTE — Progress Notes (Signed)
[place] for Heparin  Indication: chest pain/ACS  Allergies  Allergen Reactions  . Azo [Phenazopyridine] Hives and Swelling    FACIAL SWELLING, CHEST TIGHTNESS  . Morphine And Related Nausea And Vomiting and Other (See Comments)    DIZZINESS  . Opium Itching and Nausea And Vomiting    OPIODS/NARCOTICS - ITCHING OF NOSE, DIZZINESS  . Sulfa Antibiotics Rash    Patient Measurements: Height: 5\' 7"  (170.2 cm) Weight: 99.8 kg (220 lb) IBW/kg (Calculated) : 61.6 Heparin Dosing Weight: 83.8 kg   Vital Signs: Temp: 99 F (37.2 C) (09/07 2350) Temp Source: Oral (09/07 2350) BP: 107/66 (09/08 0200) Pulse Rate: 64 (09/08 0200)  Labs: Recent Labs    10/11/19 1532 10/11/19 1847 10/11/19 2219 10/11/19 2219 10/12/19 0243 10/12/19 0403 10/12/19 0403 10/12/19 0920 10/12/19 1215 10/12/19 1300 10/12/19 2000 10/13/19 0400 10/13/19 0412  HGB 15.2*   < > 14.4   < >  --  14.5  --   --   --   --   --   --  14.2  HCT 46.6*   < > 44.4  --   --  43.9  --   --   --   --   --   --  44.8  PLT 263   < > 303  --   --  261  --   --   --   --   --   --  258  HEPARINUNFRC  --   --   --   --   --  0.16*   < >  --   --  0.13* 0.14* 0.31  --   CREATININE 0.76  --   --   --   --  0.58  --   --   --   --   --   --   --   TROPONINIHS 61*   < > 251*   < > 283*  --   --  348* 434*  --   --   --   --    < > = values in this interval not displayed.    Estimated Creatinine Clearance: 96.5 mL/min (by C-G formula based on SCr of 0.58 mg/dL).   Assessment: 54 yr old female presented to ED with chest pain and rising troponins. Pharmacy was consulted to start IV heparin for ACS.    Heparin level therapeutic (0.31) on gtt at 1650 units/hr. No bleeding noted. Noted cards note - likely do 48 hrs therapy.  Goal of Therapy:  Heparin level 0.3-0.7 units/ml Monitor platelets by anticoagulation protocol: Yes   Plan:  Continue heparin infusion at 1650 units/hr F/u 6 hr  confirmatory heparin level  Sherlon Handing, PharmD, BCPS Please see amion for complete clinical pharmacist phone list 10/13/2019 4:47 AM

## 2019-10-13 NOTE — Discharge Instructions (Addendum)
Patient scheduled for outpatient Remdesivir infusions at 11am on Thursday 9/9 and Friday 9/10  at [facility]. Please inform the patient to park at [place], as staff will be escorting the patient through the [place] entrance of the hospital.    There is a wave flag banner located near the entrance on [address]. Turn into this entrance and immediately turn left and park in 1 of the 5 designated Covid Infusion Parking spots. There is a phone number on the sign, please call and let the staff know what spot you are in and we will come out and get you. For questions call [phone].  Thanks.

## 2019-10-13 NOTE — Progress Notes (Signed)
Patient scheduled for outpatient Remdesivir infusions at 11am on Thursday 9/9 and Friday 9/10 at [facility]. Please inform the patient to park at [place], as staff will be escorting the patient through the [place] entrance of the hospital.    There is a wave flag banner located near the entrance on [address]. Turn into this entrance and immediately turn left and park in 1 of the 5 designated Covid Infusion Parking spots. There is a phone number on the sign, please call and let the staff know what spot you are in and we will come out and get you. For questions call [phone].  Thanks.

## 2019-10-14 ENCOUNTER — Ambulatory Visit (HOSPITAL_COMMUNITY)
Admit: 2019-10-14 | Discharge: 2019-10-14 | Disposition: A | Discharge status: Home / Self Care | Payer: No Typology Code available for payment source | Source: Ambulatory Visit | Attending: Pulmonary Disease | Admitting: Pulmonary Disease

## 2019-10-14 ENCOUNTER — Other Ambulatory Visit (HOSPITAL_COMMUNITY): Payer: Self-pay | Admitting: Adult Health

## 2019-10-14 DIAGNOSIS — U071 COVID-19: Secondary | ICD-10-CM | POA: Diagnosis not present

## 2019-10-14 DIAGNOSIS — J1282 Pneumonia due to coronavirus disease 2019: Secondary | ICD-10-CM | POA: Diagnosis not present

## 2019-10-14 MED ORDER — METHYLPREDNISOLONE SODIUM SUCC 125 MG IJ SOLR: 125.0000 mg | Freq: Once | INTRAMUSCULAR | Status: DC | PRN

## 2019-10-14 MED ORDER — ALBUTEROL SULFATE HFA 108 (90 BASE) MCG/ACT IN AERS: 2.0000 | INHALATION_SPRAY | Freq: Once | RESPIRATORY_TRACT | Status: DC | PRN

## 2019-10-14 MED ORDER — FAMOTIDINE IN NACL 20-0.9 MG/50ML-% IV SOLN: 20.0000 mg | Freq: Once | INTRAVENOUS | Status: DC | PRN

## 2019-10-14 MED ORDER — SODIUM CHLORIDE 0.9 % IV SOLN
100.0000 mg | Freq: Once | INTRAVENOUS | Status: AC
  Administered 2019-10-14: 100 mg via INTRAVENOUS
  Filled 2019-10-14: qty 20

## 2019-10-14 MED ORDER — EPINEPHRINE 0.3 MG/0.3ML IJ SOAJ: 0.3000 mg | Freq: Once | INTRAMUSCULAR | Status: DC | PRN

## 2019-10-14 MED ORDER — SODIUM CHLORIDE 0.9 % IV SOLN: 100.0000 mg | Freq: Once | INTRAVENOUS | Status: DC

## 2019-10-14 MED ORDER — SODIUM CHLORIDE 0.9 % IV SOLN: INTRAVENOUS | Status: DC | PRN

## 2019-10-14 MED ORDER — DIPHENHYDRAMINE HCL 50 MG/ML IJ SOLN: 50.0000 mg | Freq: Once | INTRAMUSCULAR | Status: DC | PRN

## 2019-10-14 NOTE — Discharge Instructions (Signed)
10 Things You Can Do to Manage Your COVID-19 Symptoms at Home If you have possible or confirmed COVID-19: 1. Stay home from work and school. And stay away from other public places. If you must go out, avoid using any kind of public transportation, ridesharing, or taxis. 2. Monitor your symptoms carefully. If your symptoms get worse, call your healthcare provider immediately. 3. Get rest and stay hydrated. 4. If you have a medical appointment, call the healthcare provider ahead of time and tell them that you have or may have COVID-19. 5. For medical emergencies, call 911 and notify the dispatch personnel that you have or may have COVID-19. 6. Cover your cough and sneezes with a tissue or use the inside of your elbow. 7. Wash your hands often with soap and water for at least 20 seconds or clean your hands with an alcohol-based hand sanitizer that contains at least 60% alcohol. 8. As much as possible, stay in a specific room and away from other people in your home. Also, you should use a separate bathroom, if available. If you need to be around other people in or outside of the home, wear a mask. 9. Avoid sharing personal items with other people in your household, like dishes, towels, and bedding. 10. Clean all surfaces that are touched often, like counters, tabletops, and doorknobs. Use household cleaning sprays or wipes according to the label instructions. cdc.gov/coronavirus 08/05/2018 This information is not intended to replace advice given to you by your health care provider. Make sure you discuss any questions you have with your health care provider. Document Revised: 01/07/2019 Document Reviewed: 01/07/2019 Elsevier Patient Education  2020 Elsevier Inc.  

## 2019-10-14 NOTE — Progress Notes (Signed)
  Diagnosis: COVID-19  Physician: Joya Gaskins    Procedure: Covid Infusion Clinic Med: remdesivir infusion - Provided patient with remdesivir fact sheet for patients, parents and caregivers prior to infusion.  Complications: No immediate complications noted.  Discharge: Discharged home, patient to return tomorrow at 11 am for last dose.  Chyrel Masson 10/14/2019

## 2019-10-15 ENCOUNTER — Other Ambulatory Visit: Payer: Self-pay | Admitting: Family

## 2019-10-15 ENCOUNTER — Ambulatory Visit (HOSPITAL_COMMUNITY)
Admit: 2019-10-15 | Discharge: 2019-10-15 | Disposition: A | Discharge status: Home / Self Care | Payer: No Typology Code available for payment source | Attending: Pulmonary Disease | Admitting: Pulmonary Disease

## 2019-10-15 DIAGNOSIS — U071 COVID-19: Secondary | ICD-10-CM

## 2019-10-15 DIAGNOSIS — J1282 Pneumonia due to coronavirus disease 2019: Secondary | ICD-10-CM

## 2019-10-15 MED ORDER — FAMOTIDINE IN NACL 20-0.9 MG/50ML-% IV SOLN: 20.0000 mg | Freq: Once | INTRAVENOUS | Status: DC | PRN

## 2019-10-15 MED ORDER — ALBUTEROL SULFATE HFA 108 (90 BASE) MCG/ACT IN AERS: 2.0000 | INHALATION_SPRAY | Freq: Once | RESPIRATORY_TRACT | Status: DC | PRN

## 2019-10-15 MED ORDER — DIPHENHYDRAMINE HCL 50 MG/ML IJ SOLN: 50.0000 mg | Freq: Once | INTRAMUSCULAR | Status: DC | PRN

## 2019-10-15 MED ORDER — METHYLPREDNISOLONE SODIUM SUCC 125 MG IJ SOLR: 125.0000 mg | Freq: Once | INTRAMUSCULAR | Status: DC | PRN

## 2019-10-15 MED ORDER — EPINEPHRINE 0.3 MG/0.3ML IJ SOAJ: 0.3000 mg | Freq: Once | INTRAMUSCULAR | Status: DC | PRN

## 2019-10-15 MED ORDER — SODIUM CHLORIDE 0.9 % IV SOLN
100.0000 mg | Freq: Once | INTRAVENOUS | Status: AC
  Administered 2019-10-15: 100 mg via INTRAVENOUS
  Filled 2019-10-15: qty 20

## 2019-10-15 MED ORDER — SODIUM CHLORIDE 0.9 % IV SOLN: INTRAVENOUS | Status: DC | PRN

## 2019-10-15 NOTE — Discharge Instructions (Signed)

## 2019-10-15 NOTE — Progress Notes (Addendum)
  Diagnosis: COVID-19  Physician: Wright  Procedure: Covid Infusion Clinic Med: remdesivir infusion - Provided patient with remdesivir fact sheet for patients, parents and caregivers prior to infusion.  Complications: No immediate complications noted.  Discharge: Discharged home   [name] 10/15/2019   

## 2019-10-16 LAB — CULTURE, BLOOD (ROUTINE X 2)
Culture: NO GROWTH
Culture: NO GROWTH
Special Requests: ADEQUATE

## 2019-11-11 NOTE — Progress Notes (Addendum)
Cardiology Office Note  Date: 11/12/2019   ID: [name], DOB [birth year], MRN [id number]  PCP:  Leeanne Rio, MD  Cardiologist:  Donato Heinz, MD Electrophysiologist:  None   Chief Complaint: Hospital follow-up NSTEMI, and recent Covid pneumonia   History of Present Illness: [name] is a 54 y.o. female with a history of NSTEMI, Covid pneumonia, obesity, hyperlipidemia, insulin-dependent controlled type 2 diabetes with retinopathy, migraine headaches, former tobacco user, family history of premature CAD.    She was diagnosed with Covid pneumonia on August 31 outpatient with steroids and albuterol.  She was driving to receive her monoclonal antibody infusion on 10/12/2019 with her husband who had also been diagnosed with Covid on October 03, 2019.  During the drive husband suffered a cardiac arrest.  He was brought to to the emergency room underwent a prolonged resuscitation effort which was unsuccessful and pronounced deceased.  During the time she was spending with him after his death, she started feeling her heart racing with chest pressure, dizziness, shortness of breath.  She was placed on a monitor demonstrating heart rate over 180.  She was evaluated and noted to have elevated troponins. Labs revealed negative d-dimer, hsTroponin 61->180->151->283->348, BNP 136, CRP 1.8->2.0, K slightly low at 3.4.  Initial EKG demonstrated sinus tachycardia with old anterior lateral infarct with Q waves in 1 and aVL, V1 through V3 with lateral Q waves.  She was admitted and started on IV heparin, aspirin, and metoprolol.  Cardiology was consulted for NSTEMI.  She received monoclonal antibody infusion/remdesivir and steroids with improvement.  Cardiology later cleared for discharge due to downtrending troponins and resolution of chest pain.  Her simvastatin was changed to atorvastatin prior to discharge.  She is here for hospital follow-up status post recent STEMI and Covid  pneumonia infection.  Continues to have some intermittent palpitations with some associated dizziness and chest pain when palpitations occur.  Her troponins were elevated during recent hospitalization.  Currently denies any anginal or exertional symptoms.  No CVA or TIA-like symptoms, lightheadedness, dizziness, presyncopal or syncopal episodes.  No claudication-like symptoms, DVT or PE-like symptoms, lower extremity edema.  She does point to an area on her inner forearm which feels like a fluid-filled cyst.  She states this only occurred after she left the hospital recently.  She has a family history of early heart disease and grandfather, father, uncles, and a brother.  Risk factors for coronary artery disease are diabetes, tobacco use, hyperlipidemia.  I reviewed the results of her recent cardiac monitor as well as echocardiogram with her today.   Past Medical History:  Diagnosis Date  . Claustrophobia   . Dental crown present    lower right  . Diabetic retinopathy (HCC)    OS  . Hyperlipidemia   . Hypertension    states under control with meds., has been on med. x 26 yrs.; states blood pressure goes up when she gets nervous  . Insulin dependent diabetes mellitus   . Migraines   . Obesity   . Palpitations   . Trigger thumb of right hand 09/2016    Past Surgical History:  Procedure Laterality Date  . ABDOMINAL HYSTERECTOMY     partial  . CARDIAC CATHETERIZATION  05/27/2007   normal coronary arteries  . CARPAL TUNNEL RELEASE Bilateral   . CHOLECYSTECTOMY  1991  . DE QUERVAIN'S RELEASE Right   . GANGLION CYST EXCISION Left    wrist  . INGUINAL HERNIA REPAIR Right   .  SOFT TISSUE MASS EXCISION Right 09/04/2009   gluteus  . TRIGGER FINGER RELEASE Right 10/03/2016   Procedure: RELEASE TRIGGER FINGER/A-1 PULLEY RIGHT THUMB;  Surgeon: Leanora Cover, MD;  Location: [facility];  Service: Orthopedics;  Laterality: Right;  . UTERINE SUSPENSION      Current Outpatient  Medications  Medication Sig Dispense Refill  . albuterol (VENTOLIN HFA) 108 (90 Base) MCG/ACT inhaler Inhale 2 puffs into the lungs every 4 (four) hours as needed for shortness of breath.    Marland Kitchen amLODipine (NORVASC) 5 MG tablet Take 5 mg by mouth daily.    Marland Kitchen aspirin EC 81 MG EC tablet Take 1 tablet (81 mg total) by mouth daily. Swallow whole. 30 tablet 2  . atorvastatin (LIPITOR) 40 MG tablet Take 1 tablet (40 mg total) by mouth daily. 30 tablet 2  . empagliflozin (JARDIANCE) 25 MG TABS tablet TAKE 25 MG BY MOUTH DAILY. (Patient taking differently: Take 25 mg by mouth daily. TAKE 25 MG BY MOUTH DAILY.) 90 tablet 3  . fluticasone (FLONASE) 50 MCG/ACT nasal spray Place 1 spray into both nostrils daily as needed for allergies.     Marland Kitchen insulin regular (NOVOLIN R) 100 units/mL injection Inject 2-8 Units into the skin 3 (three) times daily before meals. Per sliding scale : not provided    . meclizine (ANTIVERT) 25 MG tablet Take 25 mg by mouth 3 (three) times daily as needed for dizziness.    . metFORMIN (GLUCOPHAGE) 1000 MG tablet Take 1 tablet (1,000 mg total) by mouth 2 (two) times daily with a meal. 180 tablet 3  . metoprolol tartrate (LOPRESSOR) 25 MG tablet Take 1 tablet (25 mg total) by mouth 2 (two) times daily. 60 tablet 2  . SUMAtriptan (IMITREX) 50 MG tablet Take 1 tablet (50 mg total) by mouth every 2 (two) hours as needed for migraine. May repeat in 2 hours if headache persists or recurs. 12 tablet 11  . traZODone (DESYREL) 50 MG tablet Take 25 tablets by mouth at bedtime as needed.    . valsartan-hydrochlorothiazide (DIOVAN-HCT) 320-12.5 MG tablet Take 1 tablet by mouth daily.     No current facility-administered medications for this visit.   Allergies:  Azo [phenazopyridine], Morphine and related, Opium, and Sulfa antibiotics   Social History: The patient  reports that she quit smoking about 22 years ago. She has never used smokeless tobacco. She reports previous alcohol use. She reports  that she does not use drugs.   Family History: The patient's family history includes Cancer in her brother, mother, sister, and another family member; Heart attack in her brother, father, and another family member; Heart disease in her brother, father, paternal grandfather, and paternal grandmother; Hyperlipidemia in an other family member; Hypertension in an other family member.   ROS:  Please see the history of present illness. Otherwise, complete review of systems is positive for none.  All other systems are reviewed and negative.   Physical Exam: VS:  BP 120/70   Pulse 66   Ht 5\' 7"  (1.702 m)   Wt 225 lb 6.4 oz (102.2 kg)   SpO2 97%   BMI 35.30 kg/m , BMI Body mass index is 35.3 kg/m.  Wt Readings from Last 3 Encounters:  11/12/19 225 lb 6.4 oz (102.2 kg)  10/13/19 222 lb 3.6 oz (100.8 kg)  08/08/17 257 lb 12.8 oz (116.9 kg)    General: Obese patient appears comfortable at rest. Neck: Supple, no elevated JVP or carotid bruits, no thyromegaly.  Lungs: Clear to auscultation, nonlabored breathing at rest. Cardiac: Regular rate and rhythm, no S3 or significant systolic murmur, no pericardial rub. Extremities: No pitting edema, distal pulses 2+. Skin: Warm and dry. Musculoskeletal: No kyphosis. Neuropsychiatric: Alert and oriented x3, affect grossly appropriate.  ECG:  EKG October 12, 2019 sinus rhythm left anterior fascicular block, anterior infarct, old heart rate is 79.  Recent Labwork: 10/11/2019: B Natriuretic Peptide 136.7 10/12/2019: TSH 1.391 10/13/2019: ALT 24; AST 22; BUN 18; Creatinine, Ser 0.52; Hemoglobin 14.2; Magnesium 2.0; Platelets 258; Potassium 3.6; Sodium 136     Component Value Date/Time   CHOL 149 10/12/2019 0243   TRIG 181 (H) 10/12/2019 0243   HDL 23 (L) 10/12/2019 0243   CHOLHDL 6.5 10/12/2019 0243   VLDL 36 10/12/2019 0243   LDLCALC 90 10/12/2019 0243    Other Studies Reviewed Today:  Cardiac telemetry monitoring 10/21/2019  2 episodes of NSVT,  longest 5 beats.  14 days of data recorded on Zio monitor. Patient had a min HR of 44 bpm, max HR of 130 bpm, and avg HR of 71 bpm. Predominant underlying rhythm was Sinus Rhythm. No SVT, atrial fibrillation, high degree block, or pauses noted. 2 episodes of NSVT, longest 5 beats.  Isolated atrial and ventricular ectopy was rare (<1%). There were 24 triggered events, corresponding to sinus rhythm +/- PACs/PVCs and NSVT.   CT angio chest PE with and/or without contrast 10/11/2019  IMPRESSION: 1. No pulmonary embolus. 2. Heterogeneous pulmonary parenchyma with geographic areas of interspersed ground-glass opacity. Findings likely represent a combination of small airways disease and COVID-19 pneumonia. 3. Mild central bronchial thickening. 4. Ectatic ascending aorta at 4 cm. Recommend annual imaging followup by CTA or MRA.  Echocardiogram 10/12/2019   IMPRESSIONS  1. Left ventricular ejection fraction, by estimation, is 60 to 65%. The left ventricle has normal function. The left ventricle has no regional wall motion abnormalities. Left ventricular diastolic parameters are consistent with Grade I diastolic dysfunction (impaired relaxation).  2. Right ventricular systolic function is normal. The right ventricular size is normal. Tricuspid regurgitation signal is inadequate for assessing PA pressure.  3. The mitral valve is normal in structure. No evidence of mitral valve regurgitation. No evidence of mitral stenosis.  4. The aortic valve is normal in structure. Aortic valve regurgitation is not visualized. No aortic stenosis is present.  5. The inferior vena cava is normal in size with greater than 50% respiratory variability, suggesting right atrial pressure of 3 mmHg.  Assessment and Plan:  1. NSTEMI (non-ST elevated myocardial infarction) ([place])   2. Pneumonia due to COVID-19 virus   3. Essential hypertension   4. Mixed hyperlipidemia   5. Controlled type 2 diabetes mellitus with proliferative retinopathy  of left eye, with long-term current use of insulin, macular edema presence unspecified, unspecified proliferative retinopathy * (HCC)   6. Chest pain of uncertain etiology    1. NSTEMI (non-ST elevated myocardial infarction) ([place]) Recent NSTEMIS 10/12/2019 he was evaluated and noted to have elevated troponins. Labs revealed negative d-dimer, hsTroponin 61->180->151->283->348.  She has a strong family history of coronary artery disease.  Given recent MI and multiple risk factors and continued chest pain when palpitations occur she needs to undergo Lexiscan stress test to rule out ischemia.  2. Pneumonia due to COVID-19 virus Recent Covid pneumonia.  Patient states she has been doing reasonably well.  She received outpatient remdesivir infusions status post discharge from hospital.  3. Essential hypertension Blood pressure is well controlled today.  Continue amlodipine 5 mg p.o. daily, metoprolol 25 mg p.o. twice daily.,  Valsartan HCTZ 320 mg / 12.5 mg daily.  4. Mixed hyperlipidemia Continue atorvastatin 40 mg p.o. daily.  5. Controlled type 2 diabetes mellitus with proliferative retinopathy of left eye, with long-term current use of insulin, macular edema presence unspecified, unspecified proliferative retinopathy * ([place]) Patient is continuing Jardiance 25 mg daily, Metformin 1000 mg p.o. twice daily as well as regular insulin 2 to 8 units 3 times daily before meals per sliding scale.  Follows with PCP for diabetes  5.  Palpitations/NSVT Recent cardiac telemetry monitoring showed 2 episodes of NSVT with longest duration of 5 beats.  Predominant rhythm was sinus.  No SVT, atrial fibrillation, high degree AV block, pauses isolated atrial and ventricular ectopy was rare less than 1%.  24 triggered events corresponding to sinus rhythm plus or minus PACs/PVCs and NSVT Advised her she could take an extra half dose of metoprolol 12.5 mg as needed in addition to scheduled for increased  palpitations.  Medication Adjustments/Labs and Tests Ordered: Current medicines are reviewed at length with the patient today.  Concerns regarding medicines are outlined above.   Disposition: Follow-up with Dr. Domenic Polite or APP 4 to 6 weeks  Signed, Levell July, NP 11/12/2019 8:24 AM    [place] at [place], [place], [place] [address] Phone: [phone]; Fax: [phone]

## 2019-11-12 ENCOUNTER — Encounter: Payer: Self-pay | Admitting: *Deleted

## 2019-11-12 ENCOUNTER — Encounter: Payer: Self-pay | Admitting: Family Medicine

## 2019-11-12 ENCOUNTER — Other Ambulatory Visit: Payer: Self-pay

## 2019-11-12 ENCOUNTER — Ambulatory Visit (INDEPENDENT_AMBULATORY_CARE_PROVIDER_SITE_OTHER): Payer: No Typology Code available for payment source | Admitting: Family Medicine

## 2019-11-12 VITALS — BP 120/70 | HR 66 | Ht 67.0 in | Wt 225.4 lb

## 2019-11-12 DIAGNOSIS — E782 Mixed hyperlipidemia: Secondary | ICD-10-CM | POA: Diagnosis not present

## 2019-11-12 DIAGNOSIS — U071 COVID-19: Secondary | ICD-10-CM | POA: Diagnosis not present

## 2019-11-12 DIAGNOSIS — I214 Non-ST elevation (NSTEMI) myocardial infarction: Secondary | ICD-10-CM | POA: Diagnosis not present

## 2019-11-12 DIAGNOSIS — Z794 Long term (current) use of insulin: Secondary | ICD-10-CM

## 2019-11-12 DIAGNOSIS — I10 Essential (primary) hypertension: Secondary | ICD-10-CM

## 2019-11-12 DIAGNOSIS — E113592 Type 2 diabetes mellitus with proliferative diabetic retinopathy without macular edema, left eye: Secondary | ICD-10-CM

## 2019-11-12 DIAGNOSIS — R079 Chest pain, unspecified: Secondary | ICD-10-CM

## 2019-11-12 DIAGNOSIS — J1282 Pneumonia due to coronavirus disease 2019: Secondary | ICD-10-CM

## 2019-11-12 DIAGNOSIS — R0789 Other chest pain: Secondary | ICD-10-CM

## 2019-11-12 MED ORDER — METOPROLOL TARTRATE 25 MG PO TABS: 25.0000 mg | ORAL_TABLET | Freq: Two times a day (BID) | ORAL | Status: DC

## 2019-11-12 NOTE — Patient Instructions (Signed)
Medication Instructions:   May take an extra 1/2 tab of your Lopressor as needed for palpitations.  Continue all other medications.    Labwork: none  Testing/Procedures:  Your physician has requested that you have a lexiscan myoview. For further information please visit HugeFiesta.tn. Please follow instruction sheet, as given.  Office will contact with results via phone or letter.    Follow-Up: 4-6 weeks   Any Other Special Instructions Will Be Listed Below (If Applicable).  If you need a refill on your cardiac medications before your next appointment, please call your pharmacy.

## 2019-11-16 ENCOUNTER — Other Ambulatory Visit: Payer: Self-pay

## 2019-11-16 ENCOUNTER — Encounter (HOSPITAL_COMMUNITY): Admission: RE | Admit: 2019-11-16 | Payer: No Typology Code available for payment source | Source: Ambulatory Visit

## 2019-11-16 ENCOUNTER — Encounter (HOSPITAL_COMMUNITY)
Admission: RE | Admit: 2019-11-16 | Discharge: 2019-11-16 | Disposition: A | Discharge status: Home / Self Care | Payer: No Typology Code available for payment source | Source: Ambulatory Visit | Attending: Family Medicine | Admitting: Family Medicine

## 2019-11-16 DIAGNOSIS — R0789 Other chest pain: Secondary | ICD-10-CM | POA: Insufficient documentation

## 2019-11-16 DIAGNOSIS — R079 Chest pain, unspecified: Secondary | ICD-10-CM

## 2019-11-24 ENCOUNTER — Ambulatory Visit (HOSPITAL_COMMUNITY): Payer: No Typology Code available for payment source

## 2019-11-24 ENCOUNTER — Encounter (HOSPITAL_COMMUNITY): Payer: No Typology Code available for payment source

## 2019-12-02 ENCOUNTER — Encounter (HOSPITAL_COMMUNITY): Payer: No Typology Code available for payment source

## 2019-12-10 ENCOUNTER — Ambulatory Visit: Payer: No Typology Code available for payment source | Admitting: Family Medicine

## 2020-08-29 ENCOUNTER — Other Ambulatory Visit: Payer: Self-pay | Admitting: Family Medicine

## 2020-08-29 DIAGNOSIS — Z1231 Encounter for screening mammogram for malignant neoplasm of breast: Secondary | ICD-10-CM

## 2021-03-01 ENCOUNTER — Encounter (INDEPENDENT_AMBULATORY_CARE_PROVIDER_SITE_OTHER): Payer: Self-pay

## 2021-03-06 ENCOUNTER — Ambulatory Visit (INDEPENDENT_AMBULATORY_CARE_PROVIDER_SITE_OTHER): Payer: Managed Care, Other (non HMO) | Admitting: Ophthalmology

## 2021-03-06 ENCOUNTER — Other Ambulatory Visit: Payer: Self-pay

## 2021-03-06 ENCOUNTER — Encounter (INDEPENDENT_AMBULATORY_CARE_PROVIDER_SITE_OTHER): Payer: Self-pay | Admitting: Ophthalmology

## 2021-03-06 DIAGNOSIS — E113393 Type 2 diabetes mellitus with moderate nonproliferative diabetic retinopathy without macular edema, bilateral: Secondary | ICD-10-CM

## 2021-03-06 DIAGNOSIS — H53412 Scotoma involving central area, left eye: Secondary | ICD-10-CM | POA: Diagnosis not present

## 2021-03-06 NOTE — Progress Notes (Signed)
03/06/2021     CHIEF COMPLAINT Patient presents for  Chief Complaint  Patient presents with   Retina Evaluation      HISTORY OF PRESENT ILLNESS: [name] is a 56 y.o. female who presents to the clinic today for:   HPI     Retina Evaluation           Laterality: both eyes   Onset: 1 week ago   Duration: 1 week   Associated Symptoms: Floaters, Distortion and Blind Spot.  Negative for Flashes         Comments   NP- blurry vision along with some pain OS- referred by Dr. Huel Cote  Pt states, "I have diabetic retinopathy in my left eye, I was told that by an optometrist. I have had blurry vision in my left eye for awhile. But last week I noticed a blind spot in the center of my vision that I cannot see through in my left eye. I am also having a lot of sharp pain in my left eye. I also am seeing a lot of green and blue colors off to the side of my left eye." Similar spot was present in the left eye some 1-1/2 years ago with evaluation by Dr. Coralyn Pear who uncovered mild macular edema at that time for which he recommended commended intravitreal antivegF, bevacizumab, which was declined by patient and no follow-up  Further history was that she had nerve edema in the past left eye for which she underwent no ophthalmologic evaluation "No findings". but there Reported at the time of Dr. Dairl Ponder evaluation November 2020   Patient awakened last week with a blind spot paracentral in the left eye where pieces of words are missing in the left eye Much worse than 1-1/2 to 2 years ago       Last edited by Hurman Horn, MD on 03/06/2021  3:38 PM.      Referring physician: Leticia Clas, [facility] [address] [place],  [place] [address]  HISTORICAL INFORMATION:   Selected notes from the MEDICAL RECORD NUMBER    Lab Results  Component Value Date   HGBA1C 8.0 (H) 10/13/2019     CURRENT MEDICATIONS: No current outpatient medications on file. (Ophthalmic Drugs)   No current  facility-administered medications for this visit. (Ophthalmic Drugs)   Current Outpatient Medications (Other)  Medication Sig   albuterol (VENTOLIN HFA) 108 (90 Base) MCG/ACT inhaler Inhale 2 puffs into the lungs every 4 (four) hours as needed for shortness of breath.   amLODipine (NORVASC) 5 MG tablet Take 5 mg by mouth daily.   aspirin EC 81 MG EC tablet Take 1 tablet (81 mg total) by mouth daily. Swallow whole.   atorvastatin (LIPITOR) 40 MG tablet Take 1 tablet (40 mg total) by mouth daily.   empagliflozin (JARDIANCE) 25 MG TABS tablet TAKE 25 MG BY MOUTH DAILY. (Patient taking differently: Take 25 mg by mouth daily. TAKE 25 MG BY MOUTH DAILY.)   fluticasone (FLONASE) 50 MCG/ACT nasal spray Place 1 spray into both nostrils daily as needed for allergies.    insulin regular (NOVOLIN R) 100 units/mL injection Inject 2-8 Units into the skin 3 (three) times daily before meals. Per sliding scale : not provided   meclizine (ANTIVERT) 25 MG tablet Take 25 mg by mouth 3 (three) times daily as needed for dizziness.   metFORMIN (GLUCOPHAGE) 1000 MG tablet Take 1 tablet (1,000 mg total) by mouth 2 (two) times daily with  a meal.   metoprolol tartrate (LOPRESSOR) 25 MG tablet Take 1 tablet (25 mg total) by mouth 2 (two) times daily. (may take an extra 1/2 tab as needed for palpitations)   SUMAtriptan (IMITREX) 50 MG tablet Take 1 tablet (50 mg total) by mouth every 2 (two) hours as needed for migraine. May repeat in 2 hours if headache persists or recurs.   traZODone (DESYREL) 50 MG tablet Take 25 tablets by mouth at bedtime as needed.   valsartan-hydrochlorothiazide (DIOVAN-HCT) 320-12.5 MG tablet Take 1 tablet by mouth daily.   No current facility-administered medications for this visit. (Other)      REVIEW OF SYSTEMS: ROS   Negative for: Constitutional, Gastrointestinal, Neurological, Skin, Genitourinary, Musculoskeletal, HENT, Endocrine, Cardiovascular, Eyes, Respiratory, Psychiatric,  Allergic/Imm, Heme/Lymph Last edited by Hurman Horn, MD on 03/06/2021  3:38 PM.       ALLERGIES Allergies  Allergen Reactions   Azo [Phenazopyridine] Hives and Swelling    FACIAL SWELLING, CHEST TIGHTNESS   Morphine And Related Nausea And Vomiting and Other (See Comments)    DIZZINESS   Opium Itching and Nausea And Vomiting    OPIODS/NARCOTICS - ITCHING OF NOSE, DIZZINESS   Sulfa Antibiotics Rash    PAST MEDICAL HISTORY Past Medical History:  Diagnosis Date   Claustrophobia    Dental crown present    lower right   Diabetic retinopathy ([place])    OS   Hyperlipidemia    Hypertension    states under control with meds., has been on med. x 26 yrs.; states blood pressure goes up when she gets nervous   Insulin dependent diabetes mellitus    Migraines    Obesity    Palpitations    Trigger thumb of right hand 09/2016   Past Surgical History:  Procedure Laterality Date   ABDOMINAL HYSTERECTOMY     partial   CARDIAC CATHETERIZATION  05/27/2007   normal coronary arteries   CARPAL TUNNEL RELEASE Bilateral    CHOLECYSTECTOMY  1991   DE QUERVAIN'S RELEASE Right    GANGLION CYST EXCISION Left    wrist   INGUINAL HERNIA REPAIR Right    SOFT TISSUE MASS EXCISION Right 09/04/2009   gluteus   TRIGGER FINGER RELEASE Right 10/03/2016   Procedure: RELEASE TRIGGER FINGER/A-1 PULLEY RIGHT THUMB;  Surgeon: Leanora Cover, MD;  Location: [facility];  Service: Orthopedics;  Laterality: Right;   UTERINE SUSPENSION      FAMILY HISTORY Family History  Problem Relation Age of Onset   Cancer Other    Hypertension Other    Hyperlipidemia Other    Heart attack Other    Cancer Mother        Colorectal   Heart disease Father    Heart attack Father    Cancer Sister        Colorectal   Cancer Brother        Colorectal   Heart disease Brother    Heart attack Brother    Heart disease Paternal Grandmother    Heart disease Paternal Grandfather     SOCIAL HISTORY Social  History   Tobacco Use   Smoking status: Former    Types: Cigarettes    Quit date: 02/03/1997    Years since quitting: 24.1   Smokeless tobacco: Never   Tobacco comments:    Smoked for 15 years  Vaping Use   Vaping Use: Never used  Substance Use Topics   Alcohol use: Not Currently    Comment: occasionally, rare  Drug use: No         OPHTHALMIC EXAM:  Base Eye Exam     Visual Acuity (ETDRS)       Right Left   Dist cc 20/25 20/100   Dist ph cc  20/60 -2    Correction: Glasses         Tonometry (Tonopen, 3:02 PM)       Right Left   Pressure 22 21         Pupils       Pupils Dark Light Shape React APD   Right PERRL 5 4 Round Brisk None   Left PERRL 5 4 Round Brisk None         Visual Fields (Counting fingers)       Left Right     Full   Restrictions Partial inner inferior temporal deficiency          Extraocular Movement       Right Left    Full, Ortho Full, Ortho         Neuro/Psych     Oriented x3: Yes   Mood/Affect: Normal         Dilation     Both eyes: 1.0% Mydriacyl, 2.5% Phenylephrine @ 3:00 PM           Slit Lamp and Fundus Exam     External Exam       Right Left   External Normal Normal   OD, eyelids closed, no globe tenderness to gentle palpation through the close dilated  OS mild achiness to the globe with gentle palpation through the closed eyelid, in addition to sharp pain she experienced at onset of new vision loss         Slit Lamp Exam       Right Left   Lids/Lashes Normal Normal   Conjunctiva/Sclera White and quiet White and quiet   Cornea Clear Clear   Anterior Chamber Deep and quiet Deep and quiet   Iris Round and reactive Round and reactive   Lens 1+ Nuclear sclerosis 1+ Nuclear sclerosis   Anterior Vitreous Normal Normal         Fundus Exam       Right Left   Posterior Vitreous Normal Normal   Disc Normal Normal   C/D Ratio 0.35 0.4   Macula Microaneurysms, no clinically  significant macular edema Microaneurysms, no clinically significant macular edema   Vessels NPDR- Moderate NPDR- Moderate   Periphery Normal Normal            IMAGING AND PROCEDURES  Imaging and Procedures for 03/06/21  OCT, Retina - OU - Both Eyes       Right Eye Quality was good. Central Foveal Thickness: 300. Progression has no prior data. Findings include normal foveal contour, no IRF, no SRF.   Left Eye Quality was good. Central Foveal Thickness: 295. Progression has no prior data. Findings include abnormal foveal contour, intraretinal fluid, no SRF, intraretinal hyper-reflective material (Large, central inter retinal cyst).   Notes *Images captured and stored on drive  Diagnosis / Impression:  OD: NFP, no IRF/SRF, minor intraretinal perifoveal OS: Minor perifoveal CME does not look active adjacent to fibrotic, temporal aspect of macula  Clinical management:  See below  Abbreviations: NFP - Normal foveal profile. CME - cystoid macular edema. PED - pigment epithelial detachment. IRF - intraretinal fluid. SRF - subretinal fluid. EZ - ellipsoid zone. ERM - epiretinal membrane. ORA - outer retinal atrophy. ORT - outer  retinal tubulation. SRHM - subretinal hyper-reflective material      Color Fundus Photography Optos - OU - Both Eyes       Right Eye Progression has no prior data. Disc findings include normal observations. Macula : microaneurysms, normal observations. Vessels : normal observations. Periphery : normal observations.   Left Eye Progression has no prior data. Disc findings include normal observations. Macula : microaneurysms, normal observations. Vessels : normal observations. Periphery : normal observations.   Notes OU with microaneurysms, mild to moderate NPDR, adequate for 1 year follow-up examination             ASSESSMENT/PLAN:  Moderate nonproliferative diabetic retinopathy of both eyes without macular edema associated with type 2 diabetes  mellitus (HCC) Minor perifoveal findings not full-thickness macula from microaneurysms near the fovea OU, with no accumulation of CSME.  Thus not likely causative of symptoms that she would awaken with 1 day    Central scotoma, left eye Paracentral scotoma symptomatic left eye, visual field to confrontation there is a slight decrease in the visual field inferotemporal visual field OS to physician testing.  There is no uncommon at homonymous field change in the right eye  Will recommend the need for visual field formal testing, refractive testing as well and a general ophthalmology office, will set up with [facility] in the coming days.  Within relatively spared and intact vision, does not need urgent neuro-ophthalmology referral at this point, but this does raise the specter of potential recurrent retrobulbar are optic neuritis, because of the absence of optic nerve atrophy or edema seen today in either eye     ICD-10-CM   1. Moderate nonproliferative diabetic retinopathy of both eyes without macular edema associated with type 2 diabetes mellitus (HCC)  U76.5465 OCT, Retina - OU - Both Eyes    Color Fundus Photography Optos - OU - Both Eyes    2. Central scotoma, left eye  H53.412       1.  OS with mild diffuse pain to the globe upon palpation with new onset growing scotoma with no evidence of optic nerve injury, optic nerve edema, optic atrophy and no retinal etiology for her symptoms, suggest the possibility of retrobulbar optic neuritis.  2.  OU need visual field testing to look for homonymous or heterogenous visual field defects  3.  Other testing of visual functioning include being Ishihara color plates suggest there may be an optic nerve dysfunction left eye however subjective test such as brightness sensation as well as red desaturation do not correspond with similar findings   Will suggest general ophthalmologic work-up, will send to [facility] care with either Gerald Stabs or Wyatt Portela to evaluate full eye examination with consultation regarding refraction as well as Humphrey visual field testing 30-2 to look for scotoma or hemianopia in either eye Ophthalmic Meds Ordered this visit:  No orders of the defined types were placed in this encounter.      Return in about 3 months (around 06/03/2021) for COLOR FP, dilate, DILATE OU, OCT.  There are no Patient Instructions on file for this visit.   Explained the diagnoses, plan, and follow up with the patient and they expressed understanding.  Patient expressed understanding of the importance of proper follow up care.   Clent Demark Katilynn Sinkler M.D. Diseases & Surgery of the Retina and Vitreous [facility] [place] 03/06/21     Abbreviations: M myopia (nearsighted); A astigmatism; H hyperopia (farsighted); P presbyopia; Mrx spectacle prescription;  CTL contact lenses; OD right eye; OS left eye; OU both eyes  XT exotropia; ET esotropia; PEK punctate epithelial keratitis; PEE punctate epithelial erosions; DES dry eye syndrome; MGD meibomian gland dysfunction; ATs artificial tears; PFAT's preservative free artificial tears; [place] nuclear sclerotic cataract; PSC posterior subcapsular cataract; ERM epi-retinal membrane; PVD posterior vitreous detachment; RD retinal detachment; DM diabetes mellitus; DR diabetic retinopathy; NPDR non-proliferative diabetic retinopathy; PDR proliferative diabetic retinopathy; CSME clinically significant macular edema; DME diabetic macular edema; dbh dot blot hemorrhages; CWS cotton wool spot; POAG primary open angle glaucoma; C/D cup-to-disc ratio; HVF humphrey visual field; GVF goldmann visual field; OCT optical coherence tomography; IOP intraocular pressure; BRVO Branch retinal vein occlusion; CRVO central retinal vein occlusion; CRAO central retinal artery occlusion; BRAO branch retinal artery occlusion; RT retinal tear; SB scleral buckle; PPV pars plana vitrectomy; VH Vitreous hemorrhage; PRP  panretinal laser photocoagulation; IVK intravitreal kenalog; VMT vitreomacular traction; MH Macular hole;  NVD neovascularization of the disc; NVE neovascularization elsewhere; AREDS age related eye disease study; ARMD age related macular degeneration; POAG primary open angle glaucoma; EBMD epithelial/anterior basement membrane dystrophy; ACIOL anterior chamber intraocular lens; IOL intraocular lens; PCIOL posterior chamber intraocular lens; Phaco/IOL phacoemulsification with intraocular lens placement; [place] photorefractive keratectomy; LASIK laser assisted in situ keratomileusis; HTN hypertension; DM diabetes mellitus; COPD chronic obstructive pulmonary disease

## 2021-03-06 NOTE — Assessment & Plan Note (Signed)
Paracentral scotoma symptomatic left eye, visual field to confrontation there is a slight decrease in the visual field inferotemporal visual field OS to physician testing.  There is no uncommon at homonymous field change in the right eye  Will recommend the need for visual field formal testing, refractive testing as well and a general ophthalmology office, will set up with [facility] in the coming days.  Within relatively spared and intact vision, does not need urgent neuro-ophthalmology referral at this point, but this does raise the specter of potential recurrent retrobulbar are optic neuritis, because of the absence of optic nerve atrophy or edema seen today in either eye

## 2021-03-06 NOTE — Assessment & Plan Note (Signed)
Minor perifoveal findings not full-thickness macula from microaneurysms near the fovea OU, with no accumulation of CSME.  Thus not likely causative of symptoms that she would awaken with 1 day

## 2021-04-16 ENCOUNTER — Ambulatory Visit
Admission: RE | Admit: 2021-04-16 | Discharge: 2021-04-16 | Disposition: A | Discharge status: Home / Self Care | Payer: Managed Care, Other (non HMO) | Source: Ambulatory Visit | Attending: Family Medicine | Admitting: Family Medicine

## 2021-04-16 ENCOUNTER — Other Ambulatory Visit: Payer: Self-pay

## 2021-04-16 DIAGNOSIS — Z1231 Encounter for screening mammogram for malignant neoplasm of breast: Secondary | ICD-10-CM

## 2021-05-23 ENCOUNTER — Encounter (INDEPENDENT_AMBULATORY_CARE_PROVIDER_SITE_OTHER): Payer: Self-pay

## 2021-06-04 ENCOUNTER — Encounter (INDEPENDENT_AMBULATORY_CARE_PROVIDER_SITE_OTHER): Payer: Managed Care, Other (non HMO) | Admitting: Ophthalmology

## 2021-06-26 ENCOUNTER — Other Ambulatory Visit: Payer: Self-pay | Admitting: Neurosurgery

## 2021-07-06 NOTE — Progress Notes (Signed)
Surgical Instructions    Your procedure is scheduled on Wednesday June 14.  Report to [facility] Main Entrance "A" at 6:30 A.M., then check in with the Admitting office.  Call this number if you have problems the morning of surgery:  [phone]   If you have any questions prior to your surgery date call [phone]: Open Monday-Friday 8am-4pm    Remember:  Do not eat or drink anything after midnight the night before your surgery    Take these medicines the morning of surgery with A SIP OF WATER:  amLODipine (NORVASC) atorvastatin (LIPITOR) metoprolol tartrate (LOPRESSOR) venlafaxine XR (EFFEXOR-XR)   If needed you may take: fexofenadine (ALLEGRA) meclizine (ANTIVERT)  nitroGLYCERIN (NITROSTAT)   As of today, STOP taking any Aspirin (unless otherwise instructed by your surgeon) Aleve, Naproxen, Ibuprofen, Motrin, Advil, Goody's, BC's, all herbal medications, fish oil, and all vitamins.   WHAT DO I DO ABOUT MY DIABETES MEDICATION?  Do not take oral diabetes medicines (pills) the morning of surgery. DO NOT TAKE metFORMIN (GLUCOPHAGE) the morning of surgery.        DO NOT TAKE empagliflozin (JARDIANCE) THE DAY BEFORE SURGERY. DO NOT TAKE empagliflozin (JARDIANCE) the morning of surgery.         DO NOT TAKE glipiZIDE (GLUCOTROL) the evening before surgery.  DO NOT TAKE glipiZIDE (GLUCOTROL) the morning of surgery.                 THE NIGHT BEFORE SURGERY, take 70 units of NOVOLIN 70/30 KWIKPEN insulin.      THE MORNING OF SURGERY,  DO NOT TAKE NOVOLIN 70/30 KWIKPEN insulin.   HOW TO MANAGE YOUR DIABETES BEFORE AND AFTER SURGERY  Why is it important to control my blood sugar before and after surgery? Improving blood sugar levels before and after surgery helps healing and can limit problems. A way of improving blood sugar control is eating a healthy diet by:  Eating less sugar and carbohydrates  Increasing activity/exercise  Talking with your doctor about reaching your  blood sugar goals High blood sugars (greater than 180 mg/dL) can raise your risk of infections and slow your recovery, so you will need to focus on controlling your diabetes during the weeks before surgery. Make sure that the doctor who takes care of your diabetes knows about your planned surgery including the date and location.  How do I manage my blood sugar before surgery? Check your blood sugar at least 4 times a day, starting 2 days before surgery, to make sure that the level is not too high or low.  Check your blood sugar the morning of your surgery when you wake up and every 2 hours until you get to the Short Stay unit.  If your blood sugar is less than 70 mg/dL, you will need to treat for low blood sugar: Do not take insulin. Treat a low blood sugar (less than 70 mg/dL) with  cup of clear juice (cranberry or apple), 4 glucose tablets, OR glucose gel. Recheck blood sugar in 15 minutes after treatment (to make sure it is greater than 70 mg/dL). If your blood sugar is not greater than 70 mg/dL on recheck, call [phone] for further instructions. Report your blood sugar to the short stay nurse when you get to Short Stay.  If you are admitted to the hospital after surgery: Your blood sugar will be checked by the staff and you will probably be given insulin after surgery (instead of oral diabetes medicines) to make sure you  have good blood sugar levels. The goal for blood sugar control after surgery is 80-180 mg/dL.           Do not wear jewelry or makeup Do not wear lotions, powders, perfumes/colognes, or deodorant. Do not shave 48 hours prior to surgery.  Men may shave face and neck. Do not bring valuables to the hospital. Do not wear nail polish, gel polish, artificial nails, or any other type of covering on natural nails (fingers and toes) If you have artificial nails or gel coating that need to be removed by a nail salon, please have this removed prior to surgery. Artificial nails  or gel coating may interfere with anesthesia's ability to adequately monitor your vital signs.  [facility] is not responsible for any belongings or valuables. .   Do NOT Smoke (Tobacco/Vaping)  24 hours prior to your procedure  If you use a CPAP at night, you may bring your mask for your overnight stay.   Contacts, glasses, hearing aids, dentures or partials may not be worn into surgery, please bring cases for these belongings   For patients admitted to the hospital, discharge time will be determined by your treatment team.   Patients discharged the day of surgery will not be allowed to drive home, and someone needs to stay with them for 24 hours.   SURGICAL WAITING ROOM VISITATION Patients having surgery or a procedure in a hospital may have two support people. Children under the age of 67 must have an adult with them who is not the patient. They may stay in the waiting area during the procedure and may switch out with other visitors. If the patient needs to stay at the hospital during part of their recovery, the visitor guidelines for inpatient rooms apply.  Please refer to the [facility] website for the visitor guidelines for Inpatients (after your surgery is over and you are in a regular room).       Special instructions:    Oral Hygiene is also important to reduce your risk of infection.  Remember - BRUSH YOUR TEETH THE MORNING OF SURGERY WITH YOUR REGULAR TOOTHPASTE   [facility]- Preparing For Surgery  Before surgery, you can play an important role. Because skin is not sterile, your skin needs to be as free of germs as possible. You can reduce the number of germs on your skin by washing with CHG (chlorahexidine gluconate) Soap before surgery.  CHG is an antiseptic cleaner which kills germs and bonds with the skin to continue killing germs even after washing.     Please do not use if you have an allergy to CHG or antibacterial soaps. If your skin becomes reddened/irritated  stop using the CHG.  Do not shave (including legs and underarms) for at least 48 hours prior to first CHG shower. It is OK to shave your face.  Please follow these instructions carefully.     Shower the NIGHT BEFORE SURGERY and the MORNING OF SURGERY with CHG Soap.   If you chose to wash your hair, wash your hair first as usual with your normal shampoo. After you shampoo, rinse your hair and body thoroughly to remove the shampoo.  Then [employer] and genitals (private parts) with your normal soap and rinse thoroughly to remove soap.  After that Use CHG Soap as you would any other liquid soap. You can apply CHG directly to the skin and wash gently with a scrungie or a clean washcloth.   Apply the CHG Soap  to your body ONLY FROM THE NECK DOWN.  Do not use on open wounds or open sores. Avoid contact with your eyes, ears, mouth and genitals (private parts). Wash Face and genitals (private parts)  with your normal soap.   Wash thoroughly, paying special attention to the area where your surgery will be performed.  Thoroughly rinse your body with warm water from the neck down.  DO NOT shower/wash with your normal soap after using and rinsing off the CHG Soap.  Pat yourself dry with a CLEAN TOWEL.  Wear CLEAN PAJAMAS to bed the night before surgery  Place CLEAN SHEETS on your bed the night before your surgery  DO NOT SLEEP WITH PETS.   Day of Surgery:  Take a shower with CHG soap. Wear Clean/Comfortable clothing the morning of surgery Do not apply any deodorants/lotions.   Remember to brush your teeth WITH YOUR REGULAR TOOTHPASTE.    If you received a COVID test during your pre-op visit, it is requested that you wear a mask when out in public, stay away from anyone that may not be feeling well, and notify your surgeon if you develop symptoms. If you have been in contact with anyone that has tested positive in the last 10 days, please notify your surgeon.    Please read over the  following fact sheets that you were given.

## 2021-07-09 ENCOUNTER — Encounter (HOSPITAL_COMMUNITY): Payer: Self-pay

## 2021-07-09 ENCOUNTER — Encounter (HOSPITAL_COMMUNITY)
Admission: RE | Admit: 2021-07-09 | Discharge: 2021-07-09 | Disposition: A | Discharge status: Home / Self Care | Payer: Managed Care, Other (non HMO) | Source: Ambulatory Visit | Attending: Neurosurgery | Admitting: Neurosurgery

## 2021-07-09 ENCOUNTER — Other Ambulatory Visit: Payer: Self-pay

## 2021-07-09 VITALS — BP 178/79 | HR 64 | Temp 98.1°F | Resp 17 | Ht 67.0 in | Wt 263.9 lb

## 2021-07-09 DIAGNOSIS — E785 Hyperlipidemia, unspecified: Secondary | ICD-10-CM | POA: Diagnosis not present

## 2021-07-09 DIAGNOSIS — E119 Type 2 diabetes mellitus without complications: Secondary | ICD-10-CM | POA: Insufficient documentation

## 2021-07-09 DIAGNOSIS — I252 Old myocardial infarction: Secondary | ICD-10-CM | POA: Insufficient documentation

## 2021-07-09 DIAGNOSIS — Z01818 Encounter for other preprocedural examination: Secondary | ICD-10-CM

## 2021-07-09 DIAGNOSIS — I2510 Atherosclerotic heart disease of native coronary artery without angina pectoris: Secondary | ICD-10-CM | POA: Insufficient documentation

## 2021-07-09 DIAGNOSIS — I10 Essential (primary) hypertension: Secondary | ICD-10-CM | POA: Insufficient documentation

## 2021-07-09 DIAGNOSIS — Z01812 Encounter for preprocedural laboratory examination: Secondary | ICD-10-CM | POA: Insufficient documentation

## 2021-07-09 DIAGNOSIS — Z794 Long term (current) use of insulin: Secondary | ICD-10-CM | POA: Diagnosis not present

## 2021-07-09 HISTORY — DX: Pneumonia, unspecified organism: J18.9

## 2021-07-09 HISTORY — DX: Acute myocardial infarction, unspecified: I21.9

## 2021-07-09 HISTORY — DX: Depression, unspecified: F32.A

## 2021-07-09 LAB — BASIC METABOLIC PANEL
Anion gap: 7 (ref 5–15)
BUN: 8 mg/dL (ref 6–20)
CO2: 27 mmol/L (ref 22–32)
Calcium: 9 mg/dL (ref 8.9–10.3)
Chloride: 105 mmol/L (ref 98–111)
Creatinine, Ser: 0.66 mg/dL (ref 0.44–1.00)
GFR, Estimated: >60 mL/min (ref >60)
Glucose, Bld: 205 mg/dL — ABNORMAL HIGH (ref 70–99)
Potassium: 3.4 mmol/L — ABNORMAL LOW (ref 3.5–5.1)
Sodium: 139 mmol/L (ref 135–145)

## 2021-07-09 LAB — CBC
HCT: 40 % (ref 36.0–46.0)
Hemoglobin: 13 g/dL (ref 12.0–15.0)
MCH: 27.4 pg (ref 26.0–34.0)
MCHC: 32.5 g/dL (ref 30.0–36.0)
MCV: 84.4 fL (ref 80.0–100.0)
Platelets: 363 10*3/uL (ref 150–400)
RBC: 4.74 MIL/uL (ref 3.87–5.11)
RDW: 13.4 % (ref 11.5–15.5)
WBC: 14.7 10*3/uL — ABNORMAL HIGH (ref 4.0–10.5)
nRBC: 0 % (ref 0.0–0.2)

## 2021-07-09 LAB — GLUCOSE, CAPILLARY: Glucose-Capillary: 172 mg/dL — ABNORMAL HIGH (ref 70–99)

## 2021-07-09 LAB — HEMOGLOBIN A1C
Hgb A1c MFr Bld: 7.7 % — ABNORMAL HIGH (ref 4.8–5.6)
Mean Plasma Glucose: 174.29 mg/dL

## 2021-07-09 NOTE — Progress Notes (Signed)
PCP - Alethea Rucker Cardiologist - [place] (from [facility]) Clearance in paper chart   Chest x-ray - 08/27/20 EKG - 08/28/20 ECHO - 10/12/19 Cardiac Cath - 05/26/07  DM - Type 2 Fasting Blood Sugar - 120-130   Anesthesia review: cardiac history, received cardiac clearance on 07/05/21  Patient denies shortness of breath, fever, cough and chest pain at PAT appointment   All instructions explained to the patient, with a verbal understanding of the material. Patient agrees to go over the instructions while at home for a better understanding. Patient also instructed to self quarantine after being tested for COVID-19. The opportunity to ask questions was provided.

## 2021-07-10 NOTE — Progress Notes (Signed)
Anesthesia Chart Review:  Follows with cardiology for hx of CAD s/p NSTEMI 10/2019 (in setting of covid infection and intense emotional stress), HTN, HLD. Nuclear stress 10/12/20 was negative for ischemia. Last seen by radiologist Dr. Candis Musa at [facility] 01/18/21. No changes to management, 1 year followup recommended.  Dr. Candis Musa commented on surgery per telephone encounter 06/28/21 stating, "As long as no changes in symptoms, the risk is considered moderate.  If plan to proceed, recommend to continue beta-blocker therapy without interruption."  Preop labs reviewed, WBC mildly elevated at 14.7, glucose elevated 205.  A1c 7.7 indicating suboptimal control of insulin-dependent DM2.  EKG 01/18/2021 (copy on chart): NSR.  Rate 69.  Possible anterior infarct, age undetermined.  Nuclear stress 10/12/20 (care everywhere): - Normal myocardial perfusion study  - No evidence of any significant ischemia or scar  - Left ventricular systolic function is normal. Post stress the ejection  fraction is > 60%.  - Coronary calcifications are noted  - Incidentally noted on the attenuation CT scan likely heterogenous  attenuation of the lung parenchyma, which was seen on previous CT dated on  03/12/2020.   TTE 10/12/19:  1. Left ventricular ejection fraction, by estimation, is 60 to 65%. The  left ventricle has normal function. The left ventricle has no regional  wall motion abnormalities. Left ventricular diastolic parameters are  consistent with Grade I diastolic  dysfunction (impaired relaxation).   2. Right ventricular systolic function is normal. The right ventricular  size is normal. Tricuspid regurgitation signal is inadequate for assessing  PA pressure.   3. The mitral valve is normal in structure. No evidence of mitral valve  regurgitation. No evidence of mitral stenosis.   4. The aortic valve is normal in structure. Aortic valve regurgitation is  not visualized. No aortic stenosis is present.   5. The inferior  vena cava is normal in size with greater than 50%  respiratory variability, suggesting right atrial pressure of 3 mmHg.     Wynonia Musty [facility]/Anesthesiology Phone [phone] 07/10/2021 10:55 AM

## 2021-07-10 NOTE — Anesthesia Preprocedure Evaluation (Addendum)
Anesthesia Evaluation  Patient identified by MRN, date of birth, ID band Patient awake    Reviewed: Allergy & Precautions, NPO status , Patient's Chart, lab work & pertinent test results  Airway Mallampati: II  TM Distance: >3 FB Neck ROM: Full    Dental no notable dental hx.    Pulmonary neg pulmonary ROS, former smoker,    Pulmonary exam normal breath sounds clear to auscultation       Cardiovascular hypertension, + Past MI  Normal cardiovascular exam Rhythm:Regular Rate:Normal     Neuro/Psych negative neurological ROS  negative psych ROS   GI/Hepatic negative GI ROS, Neg liver ROS,   Endo/Other  diabetes, Insulin Dependent  Renal/GU negative Renal ROS  negative genitourinary   Musculoskeletal negative musculoskeletal ROS (+)   Abdominal   Peds negative pediatric ROS (+)  Hematology negative hematology ROS (+)   Anesthesia Other Findings   Reproductive/Obstetrics negative OB ROS                            Anesthesia Physical Anesthesia Plan  ASA: 3  Anesthesia Plan: General   Post-op Pain Management: Dilaudid IV   Induction: Intravenous  PONV Risk Score and Plan: 3 and Ondansetron, Dexamethasone and Treatment may vary due to age or medical condition  Airway Management Planned: Oral ETT  Additional Equipment: Arterial line and ClearSight  Intra-op Plan:   Post-operative Plan: Extubation in OR  Informed Consent: I have reviewed the patients History and Physical, chart, labs and discussed the procedure including the risks, benefits and alternatives for the proposed anesthesia with the patient or authorized representative who has indicated his/her understanding and acceptance.     Dental advisory given  Plan Discussed with: CRNA and Surgeon  Anesthesia Plan Comments: (PAT note by Karoline Caldwell, PA-C: Follows with cardiology for hx of CAD s/p NSTEMI 10/2019 (in setting of  covid infection and intense emotional stress), HTN, HLD. Nuclear stress 10/12/20 was negative for ischemia. Last seen by radiologist Dr. Candis Musa at [facility] 01/18/21. No changes to management, 1 year followup recommended.  Dr. Candis Musa commented on surgery per telephone encounter 06/28/21 stating, "As long as no changes in symptoms, the risk is considered moderate.  If plan to proceed, recommend to continue beta-blocker therapy without interruption."  Preop labs reviewed, WBC mildly elevated at 14.7, glucose elevated 205.  A1c 7.7 indicating suboptimal control of insulin-dependent DM2.  EKG 01/18/2021 (copy on chart): NSR.  Rate 69.  Possible anterior infarct, age undetermined.  Arterial line or clear sight is fine  Nuclear stress 10/12/20 (care everywhere): - Normal myocardial perfusion study  - No evidence of any significant ischemia or scar  - Left ventricular systolic function isnormal. Post stress the ejection  fraction is > 60%.  - Coronary calcifications are noted  - Incidentally noted on the attenuation CT scan likely heterogenous  attenuation of the lung parenchyma, which was seen on previous CT dated on  03/12/2020.   TTE 10/12/19: 1. Left ventricular ejection fraction, by estimation, is 60 to 65%. The  left ventricle has normal function. The left ventricle has no regional  wall motion abnormalities. Left ventricular diastolic parameters are  consistent with Grade I diastolic  dysfunction (impaired relaxation).  2. Right ventricular systolic function is normal. The right ventricular  size is normal. Tricuspid regurgitation signal is inadequate for assessing  PA pressure.  3. The mitral valve is normal in structure. No evidence of mitral valve  regurgitation. No  evidence of mitral stenosis.  4. The aortic valve is normal in structure. Aortic valve regurgitation is  not visualized. No aortic stenosis is present.  5. The inferior vena cava is normal in size with greater than 50%   respiratory variability, suggesting right atrial pressure of 3 mmHg.  )       Anesthesia Quick Evaluation

## 2021-07-18 ENCOUNTER — Other Ambulatory Visit: Payer: Self-pay

## 2021-07-18 ENCOUNTER — Inpatient Hospital Stay (HOSPITAL_COMMUNITY)
Admission: RE | Admit: 2021-07-18 | Discharge: 2021-07-23 | DRG: 614 | Disposition: A | Discharge status: Home / Self Care | Payer: Managed Care, Other (non HMO) | Attending: Neurosurgery | Admitting: Neurosurgery

## 2021-07-18 ENCOUNTER — Inpatient Hospital Stay (HOSPITAL_COMMUNITY): Payer: Managed Care, Other (non HMO) | Admitting: Physician Assistant

## 2021-07-18 ENCOUNTER — Encounter (HOSPITAL_COMMUNITY)
Admission: RE | Disposition: A | Discharge status: Home / Self Care | Payer: Self-pay | Source: Home / Self Care | Attending: Neurosurgery

## 2021-07-18 ENCOUNTER — Inpatient Hospital Stay (HOSPITAL_COMMUNITY): Payer: Managed Care, Other (non HMO) | Admitting: Certified Registered"

## 2021-07-18 ENCOUNTER — Encounter (HOSPITAL_COMMUNITY): Payer: Self-pay | Admitting: Neurosurgery

## 2021-07-18 DIAGNOSIS — Z6841 Body Mass Index (BMI) 40.0 and over, adult: Secondary | ICD-10-CM

## 2021-07-18 DIAGNOSIS — Z888 Allergy status to other drugs, medicaments and biological substances status: Secondary | ICD-10-CM

## 2021-07-18 DIAGNOSIS — Z7982 Long term (current) use of aspirin: Secondary | ICD-10-CM | POA: Diagnosis not present

## 2021-07-18 DIAGNOSIS — Z87891 Personal history of nicotine dependence: Secondary | ICD-10-CM | POA: Diagnosis not present

## 2021-07-18 DIAGNOSIS — E11319 Type 2 diabetes mellitus with unspecified diabetic retinopathy without macular edema: Secondary | ICD-10-CM | POA: Diagnosis present

## 2021-07-18 DIAGNOSIS — D352 Benign neoplasm of pituitary gland: Secondary | ICD-10-CM | POA: Diagnosis present

## 2021-07-18 DIAGNOSIS — J3489 Other specified disorders of nose and nasal sinuses: Secondary | ICD-10-CM | POA: Diagnosis not present

## 2021-07-18 DIAGNOSIS — I252 Old myocardial infarction: Secondary | ICD-10-CM

## 2021-07-18 DIAGNOSIS — E669 Obesity, unspecified: Secondary | ICD-10-CM | POA: Diagnosis present

## 2021-07-18 DIAGNOSIS — I10 Essential (primary) hypertension: Secondary | ICD-10-CM

## 2021-07-18 DIAGNOSIS — Z794 Long term (current) use of insulin: Secondary | ICD-10-CM

## 2021-07-18 DIAGNOSIS — E893 Postprocedural hypopituitarism: Principal | ICD-10-CM

## 2021-07-18 DIAGNOSIS — Z01818 Encounter for other preprocedural examination: Secondary | ICD-10-CM

## 2021-07-18 DIAGNOSIS — E236 Other disorders of pituitary gland: Secondary | ICD-10-CM | POA: Diagnosis not present

## 2021-07-18 DIAGNOSIS — G43909 Migraine, unspecified, not intractable, without status migrainosus: Secondary | ICD-10-CM | POA: Diagnosis present

## 2021-07-18 DIAGNOSIS — Z882 Allergy status to sulfonamides status: Secondary | ICD-10-CM | POA: Diagnosis not present

## 2021-07-18 DIAGNOSIS — Z885 Allergy status to narcotic agent status: Secondary | ICD-10-CM

## 2021-07-18 DIAGNOSIS — Z7984 Long term (current) use of oral hypoglycemic drugs: Secondary | ICD-10-CM | POA: Diagnosis not present

## 2021-07-18 DIAGNOSIS — Z79899 Other long term (current) drug therapy: Secondary | ICD-10-CM

## 2021-07-18 DIAGNOSIS — E785 Hyperlipidemia, unspecified: Secondary | ICD-10-CM | POA: Diagnosis present

## 2021-07-18 DIAGNOSIS — J342 Deviated nasal septum: Secondary | ICD-10-CM

## 2021-07-18 HISTORY — PX: CRANIOTOMY: SHX93

## 2021-07-18 HISTORY — PX: SEPTOPLASTY: SHX2393

## 2021-07-18 HISTORY — PX: TRANSPHENOIDAL APPROACH EXPOSURE: SHX6311

## 2021-07-18 LAB — TYPE AND SCREEN
ABO/RH(D): O POS
Antibody Screen: NEGATIVE

## 2021-07-18 LAB — GLUCOSE, CAPILLARY
Glucose-Capillary: 193 mg/dL — ABNORMAL HIGH (ref 70–99)
Glucose-Capillary: 156 mg/dL — ABNORMAL HIGH (ref 70–99)
Glucose-Capillary: 186 mg/dL — ABNORMAL HIGH (ref 70–99)
Glucose-Capillary: 272 mg/dL — ABNORMAL HIGH (ref 70–99)
Glucose-Capillary: 256 mg/dL — ABNORMAL HIGH (ref 70–99)
Glucose-Capillary: 321 mg/dL — ABNORMAL HIGH (ref 70–99)

## 2021-07-18 SURGERY — CRANIOTOMY HYPOPHYSECTOMY TRANSNASAL APPROACH
Anesthesia: General

## 2021-07-18 MED ORDER — ACETAMINOPHEN 10 MG/ML IV SOLN
INTRAVENOUS | Status: AC
  Filled 2021-07-18: qty 100

## 2021-07-18 MED ORDER — TRIAMCINOLONE ACETONIDE 40 MG/ML IJ SUSP
INTRAMUSCULAR | Status: AC
  Filled 2021-07-18: qty 5

## 2021-07-18 MED ORDER — OXYMETAZOLINE HCL 0.05 % NA SOLN
NASAL | Status: AC
  Filled 2021-07-18: qty 30

## 2021-07-18 MED ORDER — HYDROMORPHONE HCL 1 MG/ML IJ SOLN
INTRAMUSCULAR | Status: AC
  Filled 2021-07-18: qty 0.5

## 2021-07-18 MED ORDER — CLEVIDIPINE BUTYRATE 0.5 MG/ML IV EMUL
INTRAVENOUS | Status: AC
  Filled 2021-07-18: qty 50

## 2021-07-18 MED ORDER — MECLIZINE HCL 25 MG PO TABS: 25.0000 mg | ORAL_TABLET | Freq: Three times a day (TID) | ORAL | Status: DC | PRN

## 2021-07-18 MED ORDER — OXYCODONE HCL 5 MG PO TABS: 5.0000 mg | ORAL_TABLET | Freq: Once | ORAL | Status: DC | PRN

## 2021-07-18 MED ORDER — VENLAFAXINE HCL ER 37.5 MG PO CP24
37.5000 mg | ORAL_CAPSULE | Freq: Every morning | ORAL | Status: DC
  Administered 2021-07-19 – 2021-07-23 (×5): 37.5 mg via ORAL
  Filled 2021-07-18 (×5): qty 1

## 2021-07-18 MED ORDER — HYDROMORPHONE HCL 1 MG/ML IJ SOLN: 1.0000 mg | INTRAMUSCULAR | Status: DC | PRN

## 2021-07-18 MED ORDER — BACITRACIN ZINC 500 UNIT/GM EX OINT
TOPICAL_OINTMENT | CUTANEOUS | Status: AC
  Filled 2021-07-18: qty 28.35

## 2021-07-18 MED ORDER — CHLORHEXIDINE GLUCONATE CLOTH 2 % EX PADS
6.0000 | MEDICATED_PAD | Freq: Every day | CUTANEOUS | Status: DC
  Administered 2021-07-19 – 2021-07-22 (×4): 6 via TOPICAL

## 2021-07-18 MED ORDER — ONDANSETRON HCL 4 MG/2ML IJ SOLN: 4.0000 mg | INTRAMUSCULAR | Status: DC | PRN

## 2021-07-18 MED ORDER — INSULIN ASPART 100 UNIT/ML IJ SOLN
INTRAMUSCULAR | Status: AC
  Filled 2021-07-18: qty 1

## 2021-07-18 MED ORDER — OXYMETAZOLINE HCL 0.05 % NA SOLN
NASAL | Status: DC | PRN
  Administered 2021-07-18: 1 via TOPICAL

## 2021-07-18 MED ORDER — SITAGLIPTIN PHOS-METFORMIN HCL 50-1000 MG PO TABS: 1.0000 | ORAL_TABLET | Freq: Two times a day (BID) | ORAL | Status: DC

## 2021-07-18 MED ORDER — CEFAZOLIN SODIUM-DEXTROSE 2-4 GM/100ML-% IV SOLN
2.0000 g | Freq: Three times a day (TID) | INTRAVENOUS | Status: AC
  Administered 2021-07-19: 2 g via INTRAVENOUS
  Filled 2021-07-18: qty 100

## 2021-07-18 MED ORDER — LACTATED RINGERS IV SOLN: INTRAVENOUS | Status: DC

## 2021-07-18 MED ORDER — ONDANSETRON HCL 4 MG/2ML IJ SOLN
INTRAMUSCULAR | Status: DC | PRN
  Administered 2021-07-18: 4 mg via INTRAVENOUS

## 2021-07-18 MED ORDER — BUPIVACAINE HCL (PF) 0.5 % IJ SOLN
INTRAMUSCULAR | Status: AC
  Filled 2021-07-18: qty 30

## 2021-07-18 MED ORDER — SODIUM CHLORIDE 0.9 % IV SOLN: INTRAVENOUS | Status: DC

## 2021-07-18 MED ORDER — HYDROCORTISONE SOD SUC (PF) 100 MG IJ SOLR
INTRAMUSCULAR | Status: DC | PRN
  Administered 2021-07-18: 50 mg via INTRAVENOUS

## 2021-07-18 MED ORDER — ROCURONIUM BROMIDE 10 MG/ML (PF) SYRINGE
PREFILLED_SYRINGE | INTRAVENOUS | Status: DC | PRN
  Administered 2021-07-18: 10 mg via INTRAVENOUS
  Administered 2021-07-18: 80 mg via INTRAVENOUS
  Administered 2021-07-18: 50 mg via INTRAVENOUS
  Administered 2021-07-18: 20 mg via INTRAVENOUS

## 2021-07-18 MED ORDER — MUPIROCIN 2 % EX OINT
TOPICAL_OINTMENT | CUTANEOUS | Status: AC
  Filled 2021-07-18: qty 22

## 2021-07-18 MED ORDER — MUPIROCIN CALCIUM 2 % EX CREA
TOPICAL_CREAM | CUTANEOUS | Status: AC
  Filled 2021-07-18: qty 15

## 2021-07-18 MED ORDER — INSULIN ASPART 100 UNIT/ML IJ SOLN
0.0000 [IU] | Freq: Three times a day (TID) | INTRAMUSCULAR | Status: DC
  Administered 2021-07-18: 11 [IU] via SUBCUTANEOUS
  Administered 2021-07-19: 3 [IU] via SUBCUTANEOUS
  Administered 2021-07-19: 11 [IU] via SUBCUTANEOUS
  Administered 2021-07-19 – 2021-07-20 (×3): 7 [IU] via SUBCUTANEOUS
  Administered 2021-07-20: 4 [IU] via SUBCUTANEOUS
  Administered 2021-07-21 (×2): 3 [IU] via SUBCUTANEOUS
  Administered 2021-07-21 – 2021-07-22 (×4): 4 [IU] via SUBCUTANEOUS
  Administered 2021-07-23: 3 [IU] via SUBCUTANEOUS

## 2021-07-18 MED ORDER — ACETAMINOPHEN 10 MG/ML IV SOLN
1000.0000 mg | Freq: Once | INTRAVENOUS | Status: DC | PRN
Start: 2021-07-18 — End: 2021-07-18
  Administered 2021-07-18: 1000 mg via INTRAVENOUS

## 2021-07-18 MED ORDER — DROPERIDOL 2.5 MG/ML IJ SOLN
INTRAMUSCULAR | Status: AC
  Filled 2021-07-18: qty 2

## 2021-07-18 MED ORDER — OXYCODONE HCL 5 MG/5ML PO SOLN: 5.0000 mg | Freq: Once | ORAL | Status: DC | PRN

## 2021-07-18 MED ORDER — PROMETHAZINE HCL 12.5 MG PO TABS: 12.5000 mg | ORAL_TABLET | ORAL | Status: DC | PRN

## 2021-07-18 MED ORDER — CEFAZOLIN SODIUM-DEXTROSE 2-4 GM/100ML-% IV SOLN
2.0000 g | INTRAVENOUS | Status: AC
  Administered 2021-07-18: 2 g via INTRAVENOUS
  Filled 2021-07-18: qty 100

## 2021-07-18 MED ORDER — VALSARTAN-HYDROCHLOROTHIAZIDE 320-12.5 MG PO TABS
1.0000 | ORAL_TABLET | Freq: Every day | ORAL | Status: DC
  Filled 2021-07-18: qty 1

## 2021-07-18 MED ORDER — PHENYLEPHRINE HCL-NACL 20-0.9 MG/250ML-% IV SOLN
INTRAVENOUS | Status: DC | PRN
  Administered 2021-07-18: 30 ug/min via INTRAVENOUS

## 2021-07-18 MED ORDER — HYDROCORTISONE 20 MG PO TABS
50.0000 mg | ORAL_TABLET | Freq: Three times a day (TID) | ORAL | Status: AC
  Administered 2021-07-18 (×2): 50 mg via ORAL
  Filled 2021-07-18 (×2): qty 1

## 2021-07-18 MED ORDER — THROMBIN 5000 UNITS EX SOLR
CUTANEOUS | Status: AC
  Filled 2021-07-18: qty 5000

## 2021-07-18 MED ORDER — GLIPIZIDE 10 MG PO TABS
10.0000 mg | ORAL_TABLET | Freq: Two times a day (BID) | ORAL | Status: DC
  Administered 2021-07-18: 10 mg via ORAL
  Filled 2021-07-18 (×3): qty 1

## 2021-07-18 MED ORDER — FENTANYL CITRATE (PF) 250 MCG/5ML IJ SOLN
INTRAMUSCULAR | Status: DC | PRN
Start: 2021-07-18 — End: 2021-07-18
  Administered 2021-07-18 (×5): 50 ug via INTRAVENOUS

## 2021-07-18 MED ORDER — HYDRALAZINE HCL 20 MG/ML IJ SOLN
INTRAMUSCULAR | Status: DC | PRN
  Administered 2021-07-18 (×2): 10 mg via INTRAVENOUS

## 2021-07-18 MED ORDER — METFORMIN HCL 500 MG PO TABS: 1000.0000 mg | ORAL_TABLET | Freq: Two times a day (BID) | ORAL | Status: DC

## 2021-07-18 MED ORDER — BISACODYL 10 MG RE SUPP: 10.0000 mg | Freq: Every day | RECTAL | Status: DC | PRN

## 2021-07-18 MED ORDER — PROPOFOL 10 MG/ML IV BOLUS
INTRAVENOUS | Status: AC
  Filled 2021-07-18: qty 20

## 2021-07-18 MED ORDER — ADHERUS DURAL SEALANT
PACK | TOPICAL | Status: DC | PRN
  Administered 2021-07-18: 1 via TOPICAL

## 2021-07-18 MED ORDER — HYDROCODONE-ACETAMINOPHEN 5-325 MG PO TABS
1.0000 | ORAL_TABLET | ORAL | Status: DC | PRN
  Administered 2021-07-20 (×2): 1 via ORAL
  Filled 2021-07-18 (×2): qty 1

## 2021-07-18 MED ORDER — ONDANSETRON HCL 4 MG/2ML IJ SOLN: 4.0000 mg | Freq: Once | INTRAMUSCULAR | Status: DC | PRN

## 2021-07-18 MED ORDER — ONDANSETRON HCL 4 MG PO TABS: 4.0000 mg | ORAL_TABLET | ORAL | Status: DC | PRN

## 2021-07-18 MED ORDER — SENNOSIDES-DOCUSATE SODIUM 8.6-50 MG PO TABS: 1.0000 | ORAL_TABLET | Freq: Every evening | ORAL | Status: DC | PRN

## 2021-07-18 MED ORDER — METOPROLOL TARTRATE 50 MG PO TABS
50.0000 mg | ORAL_TABLET | Freq: Two times a day (BID) | ORAL | Status: DC
  Administered 2021-07-18 – 2021-07-23 (×10): 50 mg via ORAL
  Filled 2021-07-18 (×10): qty 1

## 2021-07-18 MED ORDER — EMPAGLIFLOZIN 25 MG PO TABS: 25.0000 mg | ORAL_TABLET | Freq: Every day | ORAL | Status: DC

## 2021-07-18 MED ORDER — PANTOPRAZOLE SODIUM 40 MG IV SOLR
40.0000 mg | Freq: Every day | INTRAVENOUS | Status: DC
  Administered 2021-07-18: 40 mg via INTRAVENOUS
  Filled 2021-07-18: qty 10

## 2021-07-18 MED ORDER — LABETALOL HCL 5 MG/ML IV SOLN
INTRAVENOUS | Status: AC
  Filled 2021-07-18: qty 4

## 2021-07-18 MED ORDER — LIDOCAINE-EPINEPHRINE 1 %-1:100000 IJ SOLN
INTRAMUSCULAR | Status: AC
  Filled 2021-07-18: qty 1

## 2021-07-18 MED ORDER — SODIUM CHLORIDE 0.9 % IV SOLN: INTRAVENOUS | Status: DC | PRN

## 2021-07-18 MED ORDER — IRBESARTAN 150 MG PO TABS
300.0000 mg | ORAL_TABLET | Freq: Every day | ORAL | Status: DC
  Administered 2021-07-18 – 2021-07-23 (×6): 300 mg via ORAL
  Filled 2021-07-18: qty 1
  Filled 2021-07-18 (×5): qty 2

## 2021-07-18 MED ORDER — 0.9 % SODIUM CHLORIDE (POUR BTL) OPTIME
TOPICAL | Status: DC | PRN
  Administered 2021-07-18: 1000 mL

## 2021-07-18 MED ORDER — HYDROCORTISONE 10 MG PO TABS
10.0000 mg | ORAL_TABLET | Freq: Every evening | ORAL | Status: DC
  Administered 2021-07-20 – 2021-07-22 (×3): 10 mg via ORAL
  Filled 2021-07-18 (×5): qty 1

## 2021-07-18 MED ORDER — HYDROMORPHONE HCL 1 MG/ML IJ SOLN
INTRAMUSCULAR | Status: DC | PRN
  Administered 2021-07-18 (×2): .5 mg via INTRAVENOUS

## 2021-07-18 MED ORDER — INSULIN ASPART 100 UNIT/ML IJ SOLN
0.0000 [IU] | Freq: Every day | INTRAMUSCULAR | Status: DC
  Administered 2021-07-18: 4 [IU] via SUBCUTANEOUS

## 2021-07-18 MED ORDER — MIDAZOLAM HCL 2 MG/2ML IJ SOLN
INTRAMUSCULAR | Status: AC
  Filled 2021-07-18: qty 2

## 2021-07-18 MED ORDER — LABETALOL HCL 5 MG/ML IV SOLN
10.0000 mg | INTRAVENOUS | Status: DC | PRN
  Administered 2021-07-18 (×6): 10 mg via INTRAVENOUS
  Administered 2021-07-18 – 2021-07-19 (×3): 20 mg via INTRAVENOUS
  Administered 2021-07-20: 40 mg via INTRAVENOUS
  Administered 2021-07-20: 20 mg via INTRAVENOUS
  Administered 2021-07-20 (×4): 40 mg via INTRAVENOUS
  Administered 2021-07-20 – 2021-07-21 (×3): 20 mg via INTRAVENOUS
  Administered 2021-07-21 (×3): 40 mg via INTRAVENOUS
  Administered 2021-07-21 – 2021-07-22 (×2): 20 mg via INTRAVENOUS
  Administered 2021-07-22: 40 mg via INTRAVENOUS
  Administered 2021-07-22: 20 mg via INTRAVENOUS
  Administered 2021-07-22 (×2): 40 mg via INTRAVENOUS
  Filled 2021-07-18: qty 4
  Filled 2021-07-18 (×5): qty 8
  Filled 2021-07-18 (×3): qty 4
  Filled 2021-07-18: qty 8
  Filled 2021-07-18: qty 4
  Filled 2021-07-18 (×3): qty 8
  Filled 2021-07-18: qty 4
  Filled 2021-07-18 (×2): qty 8
  Filled 2021-07-18: qty 4
  Filled 2021-07-18: qty 8

## 2021-07-18 MED ORDER — AMLODIPINE BESYLATE 2.5 MG PO TABS
2.5000 mg | ORAL_TABLET | Freq: Every day | ORAL | Status: DC
  Administered 2021-07-19 – 2021-07-20 (×2): 2.5 mg via ORAL
  Filled 2021-07-18 (×3): qty 1

## 2021-07-18 MED ORDER — TRAZODONE HCL 50 MG PO TABS: 25.0000 mg | ORAL_TABLET | Freq: Every evening | ORAL | Status: DC | PRN

## 2021-07-18 MED ORDER — LORATADINE 10 MG PO TABS
10.0000 mg | ORAL_TABLET | Freq: Every day | ORAL | Status: DC
  Administered 2021-07-18: 10 mg via ORAL
  Filled 2021-07-18 (×2): qty 1

## 2021-07-18 MED ORDER — DROPERIDOL 2.5 MG/ML IJ SOLN
INTRAMUSCULAR | Status: DC | PRN
  Administered 2021-07-18: .625 mg via INTRAVENOUS

## 2021-07-18 MED ORDER — SALINE SPRAY 0.65 % NA SOLN
4.0000 | NASAL | Status: DC | PRN
  Administered 2021-07-19 – 2021-07-21 (×2): 4 via NASAL
  Filled 2021-07-18 (×2): qty 44

## 2021-07-18 MED ORDER — HEMOSTATIC AGENTS (NO CHARGE) OPTIME
TOPICAL | Status: DC | PRN
  Administered 2021-07-18: 1 via TOPICAL

## 2021-07-18 MED ORDER — CEPHALEXIN 500 MG PO CAPS
500.0000 mg | ORAL_CAPSULE | Freq: Three times a day (TID) | ORAL | Status: DC
  Administered 2021-07-19 – 2021-07-23 (×13): 500 mg via ORAL
  Filled 2021-07-18 (×13): qty 1

## 2021-07-18 MED ORDER — CLEVIDIPINE BUTYRATE 0.5 MG/ML IV EMUL: 0.0000 mg/h | INTRAVENOUS | Status: DC

## 2021-07-18 MED ORDER — HYDROMORPHONE HCL 1 MG/ML IJ SOLN: 0.2500 mg | INTRAMUSCULAR | Status: DC | PRN

## 2021-07-18 MED ORDER — NITROGLYCERIN 0.4 MG SL SUBL
0.4000 mg | SUBLINGUAL_TABLET | SUBLINGUAL | Status: DC | PRN
Start: 2021-07-18 — End: 2021-07-23

## 2021-07-18 MED ORDER — ORAL CARE MOUTH RINSE: 15.0000 mL | Freq: Once | OROMUCOSAL | Status: AC

## 2021-07-18 MED ORDER — LIDOCAINE-EPINEPHRINE 1 %-1:100000 IJ SOLN
INTRAMUSCULAR | Status: DC | PRN
  Administered 2021-07-18: 7 mL

## 2021-07-18 MED ORDER — LABETALOL HCL 5 MG/ML IV SOLN
INTRAVENOUS | Status: DC | PRN
  Administered 2021-07-18 (×4): 5 mg via INTRAVENOUS

## 2021-07-18 MED ORDER — PROPOFOL 10 MG/ML IV BOLUS
INTRAVENOUS | Status: DC | PRN
  Administered 2021-07-18: 200 mg via INTRAVENOUS

## 2021-07-18 MED ORDER — METFORMIN HCL 500 MG PO TABS
1000.0000 mg | ORAL_TABLET | Freq: Two times a day (BID) | ORAL | Status: DC
  Administered 2021-07-19 – 2021-07-23 (×9): 1000 mg via ORAL
  Filled 2021-07-18 (×9): qty 2

## 2021-07-18 MED ORDER — FENTANYL CITRATE (PF) 250 MCG/5ML IJ SOLN
INTRAMUSCULAR | Status: AC
  Filled 2021-07-18: qty 5

## 2021-07-18 MED ORDER — THROMBIN 5000 UNITS EX SOLR: OROMUCOSAL | Status: DC | PRN

## 2021-07-18 MED ORDER — EPHEDRINE SULFATE-NACL 50-0.9 MG/10ML-% IV SOSY
PREFILLED_SYRINGE | INTRAVENOUS | Status: DC | PRN
  Administered 2021-07-18 (×2): 5 mg via INTRAVENOUS
  Administered 2021-07-18: 2.5 mg via INTRAVENOUS

## 2021-07-18 MED ORDER — ATORVASTATIN CALCIUM 40 MG PO TABS
40.0000 mg | ORAL_TABLET | Freq: Every day | ORAL | Status: DC
  Administered 2021-07-18: 40 mg via ORAL
  Filled 2021-07-18 (×2): qty 1

## 2021-07-18 MED ORDER — ORAL CARE MOUTH RINSE: 15.0000 mL | OROMUCOSAL | Status: DC | PRN

## 2021-07-18 MED ORDER — MIDAZOLAM HCL 2 MG/2ML IJ SOLN
INTRAMUSCULAR | Status: DC | PRN
  Administered 2021-07-18: 2 mg via INTRAVENOUS

## 2021-07-18 MED ORDER — HYDROCORTISONE 20 MG PO TABS
20.0000 mg | ORAL_TABLET | Freq: Every morning | ORAL | Status: DC
  Administered 2021-07-19 – 2021-07-23 (×5): 20 mg via ORAL
  Filled 2021-07-18 (×5): qty 1

## 2021-07-18 MED ORDER — CHLORHEXIDINE GLUCONATE 0.12 % MT SOLN
15.0000 mL | Freq: Once | OROMUCOSAL | Status: AC
  Administered 2021-07-18: 15 mL via OROMUCOSAL
  Filled 2021-07-18: qty 15

## 2021-07-18 MED ORDER — CEPHALEXIN 500 MG PO CAPS
500.0000 mg | ORAL_CAPSULE | Freq: Three times a day (TID) | ORAL | 0 refills | Status: DC
Start: 2021-07-18 — End: 2021-07-23

## 2021-07-18 MED ORDER — SUGAMMADEX SODIUM 200 MG/2ML IV SOLN
INTRAVENOUS | Status: DC | PRN
  Administered 2021-07-18: 400 mg via INTRAVENOUS

## 2021-07-18 MED ORDER — INSULIN ASPART 100 UNIT/ML IJ SOLN
0.0000 [IU] | INTRAMUSCULAR | Status: AC | PRN
  Administered 2021-07-18: 2 [IU] via SUBCUTANEOUS
  Administered 2021-07-18: 4 [IU] via SUBCUTANEOUS

## 2021-07-18 MED ORDER — CHLORHEXIDINE GLUCONATE CLOTH 2 % EX PADS: 6.0000 | MEDICATED_PAD | Freq: Once | CUTANEOUS | Status: DC

## 2021-07-18 MED ORDER — LINAGLIPTIN 5 MG PO TABS
5.0000 mg | ORAL_TABLET | Freq: Every day | ORAL | Status: DC
  Administered 2021-07-19 – 2021-07-23 (×5): 5 mg via ORAL
  Filled 2021-07-18 (×5): qty 1

## 2021-07-18 MED ORDER — HYDROCHLOROTHIAZIDE 12.5 MG PO TABS
12.5000 mg | ORAL_TABLET | Freq: Every day | ORAL | Status: DC
  Administered 2021-07-18 – 2021-07-23 (×6): 12.5 mg via ORAL
  Filled 2021-07-18 (×6): qty 1

## 2021-07-18 MED ORDER — SUMATRIPTAN SUCCINATE 50 MG PO TABS: 50.0000 mg | ORAL_TABLET | ORAL | Status: DC | PRN

## 2021-07-18 MED ORDER — LIDOCAINE 2% (20 MG/ML) 5 ML SYRINGE
INTRAMUSCULAR | Status: DC | PRN
  Administered 2021-07-18: 100 mg via INTRAVENOUS

## 2021-07-18 MED ORDER — INSULIN ASPART PROT & ASPART (70-30 MIX) 100 UNIT/ML ~~LOC~~ SUSP
100.0000 [IU] | Freq: Two times a day (BID) | SUBCUTANEOUS | Status: DC
  Administered 2021-07-19 – 2021-07-23 (×8): 100 [IU] via SUBCUTANEOUS
  Filled 2021-07-18 (×2): qty 10

## 2021-07-18 SURGICAL SUPPLY — 100 items
APL SKNCLS STERI-STRIP NONHPOA (GAUZE/BANDAGES/DRESSINGS) ×2
ATTRACTOMAT 16X20 MAGNETIC DRP (DRAPES) ×2 IMPLANT
BAG COUNTER SPONGE SURGICOUNT (BAG) ×5 IMPLANT
BAG SPNG CNTER NS LX DISP (BAG) ×2
BAND INSRT 18 STRL LF DISP RB (MISCELLANEOUS)
BAND RUBBER #18 3X1/16 STRL (MISCELLANEOUS) ×4 IMPLANT
BENZOIN TINCTURE PRP APPL 2/3 (GAUZE/BANDAGES/DRESSINGS) ×3 IMPLANT
BLADE RAD40 ROTATE 4M 4 5PK (BLADE) IMPLANT
BLADE ROTATE TRICUT 4X13 M4 (BLADE) ×3 IMPLANT
BLADE SURG 10 STRL SS (BLADE) ×2 IMPLANT
BLADE SURG 11 STRL SS (BLADE) ×5 IMPLANT
BLADE SURG 15 STRL LF DISP TIS (BLADE) ×4 IMPLANT
BLADE SURG 15 STRL SS (BLADE) ×3
BUR DIAMOND 13X5 70D (BURR) IMPLANT
BUR DIAMOND CURV 15X5 15D (BURR) IMPLANT
BUR TAPER CHOANAL ATRESIA 30K (BURR) ×1 IMPLANT
CABLE BIPOLOR RESECTION CORD (MISCELLANEOUS) ×3 IMPLANT
CANISTER SUCT 3000ML PPV (MISCELLANEOUS) ×7 IMPLANT
COAGULATOR SUCT 8FR VV (MISCELLANEOUS) ×2 IMPLANT
COVER MAYO STAND STRL (DRAPES) ×3 IMPLANT
DRAPE HALF SHEET 40X57 (DRAPES) ×6 IMPLANT
DRAPE INCISE IOBAN 66X45 STRL (DRAPES) ×3 IMPLANT
DRESSING NASAL POPE 10X1.5X2.5 (GAUZE/BANDAGES/DRESSINGS) IMPLANT
DRSG NASAL POPE 10X1.5X2.5 (GAUZE/BANDAGES/DRESSINGS)
DURAPREP 26ML APPLICATOR (WOUND CARE) ×3 IMPLANT
ELECT COATED BLADE 2.86 ST (ELECTRODE) IMPLANT
ELECT NDL TIP 2.8 STRL (NEEDLE) ×2 IMPLANT
ELECT NEEDLE TIP 2.8 STRL (NEEDLE) ×3 IMPLANT
ELECT REM PT RETURN 9FT ADLT (ELECTROSURGICAL) ×6
ELECTRODE REM PT RTRN 9FT ADLT (ELECTROSURGICAL) ×4 IMPLANT
GAUZE PACKING FOLDED 2  STR (GAUZE/BANDAGES/DRESSINGS) ×3
GAUZE PACKING FOLDED 2 STR (GAUZE/BANDAGES/DRESSINGS) ×2 IMPLANT
GAUZE SPONGE 2X2 8PLY STRL LF (GAUZE/BANDAGES/DRESSINGS) ×2 IMPLANT
GLOVE BIO SURGEON STRL SZ7.5 (GLOVE) IMPLANT
GLOVE BIOGEL M 7.0 STRL (GLOVE) ×6 IMPLANT
GLOVE BIOGEL PI IND STRL 7.5 (GLOVE) ×4 IMPLANT
GLOVE BIOGEL PI INDICATOR 7.5 (GLOVE) ×2
GLOVE ECLIPSE 7.0 STRL STRAW (GLOVE) ×3 IMPLANT
GLOVE EXAM NITRILE XL STR (GLOVE) IMPLANT
GOWN STRL REUS W/ TWL LRG LVL3 (GOWN DISPOSABLE) ×4 IMPLANT
GOWN STRL REUS W/ TWL XL LVL3 (GOWN DISPOSABLE) IMPLANT
GOWN STRL REUS W/TWL 2XL LVL3 (GOWN DISPOSABLE) ×3 IMPLANT
GOWN STRL REUS W/TWL LRG LVL3 (GOWN DISPOSABLE) ×9
GOWN STRL REUS W/TWL XL LVL3 (GOWN DISPOSABLE)
GRAFT DURAGEN MATRIX 1WX1L (Tissue) ×1 IMPLANT
HEMOSTAT POWDER KIT SURGIFOAM (HEMOSTASIS) ×3 IMPLANT
HEMOSTAT SURGICEL 2X14 (HEMOSTASIS) ×1 IMPLANT
KIT BASIN OR (CUSTOM PROCEDURE TRAY) ×6 IMPLANT
KIT DRAIN CSF ACCUDRAIN (MISCELLANEOUS) IMPLANT
KIT TURNOVER KIT B (KITS) ×6 IMPLANT
KNIFE ARACHNOID DISP AM-21-S (BLADE) IMPLANT
NDL HYPO 25GX1X1/2 BEV (NEEDLE) ×2 IMPLANT
NDL HYPO 25X1 1.5 SAFETY (NEEDLE) ×2 IMPLANT
NDL SPNL 22GX3.5 QUINCKE BK (NEEDLE) ×2 IMPLANT
NDL SPNL 25GX3.5 QUINCKE BL (NEEDLE) ×2 IMPLANT
NEEDLE HYPO 25GX1X1/2 BEV (NEEDLE) ×3 IMPLANT
NEEDLE HYPO 25X1 1.5 SAFETY (NEEDLE) ×3 IMPLANT
NEEDLE SPNL 22GX3.5 QUINCKE BK (NEEDLE) ×3 IMPLANT
NEEDLE SPNL 25GX3.5 QUINCKE BL (NEEDLE) ×3 IMPLANT
NS IRRIG 1000ML POUR BTL (IV SOLUTION) ×6 IMPLANT
PAD ARMBOARD 7.5X6 YLW CONV (MISCELLANEOUS) ×8 IMPLANT
PATTIES SURGICAL .25X.25 (GAUZE/BANDAGES/DRESSINGS) IMPLANT
PATTIES SURGICAL .5 X.5 (GAUZE/BANDAGES/DRESSINGS) ×1 IMPLANT
PATTIES SURGICAL .5 X3 (DISPOSABLE) ×3 IMPLANT
PENCIL BUTTON HOLSTER BLD 10FT (ELECTRODE) ×3 IMPLANT
SEALANT ADHERUS EXTEND TIP (MISCELLANEOUS) ×1 IMPLANT
SHEATH ENDOSCRUB 0 DEG (SHEATH) ×3 IMPLANT
SHEATH ENDOSCRUB 30 DEG (SHEATH) IMPLANT
SHEATH ENDOSCRUB 45 DEG (SHEATH) IMPLANT
SPLINT NASAL AIRWAY SILICONE (MISCELLANEOUS) ×1 IMPLANT
SPLINT NASAL DOYLE BI-VL (GAUZE/BANDAGES/DRESSINGS) IMPLANT
SPLINT NASAL POSISEP X2 .8X2.3 (GAUZE/BANDAGES/DRESSINGS) ×1 IMPLANT
SPONGE GAUZE 2X2 STER 10/PKG (GAUZE/BANDAGES/DRESSINGS) ×1
SPONGE NEURO XRAY DETECT 1X3 (DISPOSABLE) ×3 IMPLANT
SPONGE SURGIFOAM ABS GEL 12-7 (HEMOSTASIS) IMPLANT
SPONGE T-LAP 4X18 ~~LOC~~+RFID (SPONGE) ×2 IMPLANT
STAPLER SKIN PROX WIDE 3.9 (STAPLE) ×3 IMPLANT
STRIP CLOSURE SKIN 1/2X4 (GAUZE/BANDAGES/DRESSINGS) ×3 IMPLANT
SUT 5.0 PDS RB-1 (SUTURE)
SUT BONE WAX W31G (SUTURE) ×3 IMPLANT
SUT ETHILON 3 0 FSL (SUTURE) IMPLANT
SUT ETHILON 3 0 PS 1 (SUTURE) ×1 IMPLANT
SUT ETHILON 6 0 P 1 (SUTURE) IMPLANT
SUT PDS AB 4-0 RB1 27 (SUTURE) IMPLANT
SUT PDS PLUS AB 5-0 RB-1 (SUTURE) IMPLANT
SUT PLAIN 4 0 ~~LOC~~ 1 (SUTURE) ×1 IMPLANT
SUT VIC AB 4-0 P-3 18X BRD (SUTURE) IMPLANT
SUT VIC AB 4-0 P3 18 (SUTURE)
SYR CONTROL 10ML LL (SYRINGE) ×3 IMPLANT
TOWEL GREEN STERILE (TOWEL DISPOSABLE) ×3 IMPLANT
TOWEL GREEN STERILE FF (TOWEL DISPOSABLE) ×5 IMPLANT
TRACKER ENT INSTRUMENT (MISCELLANEOUS) ×3 IMPLANT
TRACKER ENT PATIENT (MISCELLANEOUS) ×3 IMPLANT
TRAP SPECIMEN MUCUS 40CC (MISCELLANEOUS) IMPLANT
TRAY ENT MC OR (CUSTOM PROCEDURE TRAY) ×5 IMPLANT
TRAY FOLEY MTR SLVR 16FR STAT (SET/KITS/TRAYS/PACK) ×3 IMPLANT
TUBE CONNECTING 12X1/4 (SUCTIONS) ×3 IMPLANT
TUBING EXTENTION W/L.L. (IV SETS) ×3 IMPLANT
TUBING STRAIGHTSHOT EPS 5PK (TUBING) ×3 IMPLANT
WATER STERILE IRR 1000ML POUR (IV SOLUTION) ×3 IMPLANT

## 2021-07-18 NOTE — Op Note (Signed)
Operative Note:  ENDOSCOPIC TRANSSPHENOIDAL PITUITARY RESECTION WITH NAVIGATION      Patient: [name]  Medical record number: [id number]  Date:07/18/2021  Pre-operative Indications: 1.  Pituitary Mass     2.  Deviated nasal septum with airway obstruction       Postoperative Indications: Same  Surgical Procedure: 1. Endoscopic transsphenoidal pituitary resection with intraoperative navigation    2.  Nasal septoplasty      Anesthesia: GET  Surgeon: Delsa Bern, M.D.  Neurosurgeon: Dr. Kathyrn Sheriff  Complications: None  EBL: 100 cc  Findings: Severe septal deviation with bone spur obstructing the right nasal passageway.  Normal-appearing sinus anatomy.  Dehiscent tumor in the posterior sphenoid sinus.  Posi-Sep X2 sphenoid packing placed.  Bilateral Doyle nasal septal splints placed at the conclusion of the surgical procedure  Note: The neurosurgical component of the operative procedure is dictated as a separate operative note.   Brief History: The patient is a 56 y.o. female with a history of pituitary mass. The patient has a history of headache and visual change. The patient was referred to Dr. Kathyrn Sheriff for neurosurgical evaluation.  Patient seen by me at [facility] preoperatively with review of nasal anatomy and sinus CT scan for navigation.  Given the patient's history and findings, the above surgical procedures were recommended, risks and benefits were discussed in detail with the patient.  They understand and agree with our plan for surgery which is scheduled at [facility] under general anesthesia.  Surgical Procedure: The patient is brought to the neurosurgical operating room on 07/18/2021 and placed in supine position on the operating table. General endotracheal anesthesia was established without difficulty. When the patient was adequately anesthetized, surgical timeout was performed with correct identification of the patient and the surgical procedure.  The patient's nose was then injected with 7 cc of 1% lidocaine 1:100,000 dilution epinephrine which was injected in a submucosal fashion. The patient's nose was then packed with Afrin-soaked cottonoid pledgets were left in place for approximately 10 minutes to allow for vasoconstriction and hemostasis.  The Xomed Fusion navigation headgear was applied and anatomic and surgical landmarks were identified and confirmed, navigation was used throughout the sinus component of the surgical procedure.  The patient had a significant nasal septal deviation which was obstructing the right nasal passageway and required nasal septoplasty for completion of her pituitary resection.  With the patient prepped draped and prepared for surgery, nasal septoplasty was begun.  A right anterior hemitransfixion incision was created and a mucoperichondrial flap was elevated from anterior to posterior on the left-hand side. The anterior cartilaginous septum was crossed at the midline and a mucoperichondrial flap was elevated on the patient's right.  Swivel knife was then used to resect the anterior and mid cartilaginous portion of the nasal septum.  Resected cartilage was morcellized and returned to the mucoperichondrial pocket at the occlusion of the surgical procedure.  Dissection was then carried out from anterior to posterior removing deviated bone and cartilage including a large septal spur the overlying mucosa was preserved.  With the septum brought to good midline position, the morselized cartilage was returned to the mucoperichondrial pocket and the soft tissue/mucosal flaps were reapproximated with interrupted 4-0 gut suture on a Keith needle in a horizontal mattressing fashion.  Anterior hemitransfixion incision was closed with the same stitch.  Bilateral Doyle nasal septal splints were then placed after the application of Bactroban ointment and sutured in position with a 3-0 Ethilon suture.  With the nasal  septoplasty complete  and access to both right and left nasal passageways, nasal endoscopy was performed on the patient's right.  The middle turbinate was carefully lateralized to allow access to the posterior aspect of the nasal passageway.  The right sphenoid sinus ostium was identified.  The inferior aspect of the superior turbinate was then resected with through-cutting forceps and a microdebrider.  The right sphenoid sinus ostium was enlarged in a superior and lateral direction using the microdebrider and through-cutting forceps to create a widely patent ostium.  Nasal endoscopy on the patient's left-hand side was then undertaken.  The left middle turbinate was lateralized and the posterior nasal cavity was visualized with identification of the left sphenoid sinus ostium using navigation.  The inferior aspect of the superior turbinate was resected and the sinus ostium was enlarged in the lateral and superior direction to create a wide sphenoid sinus ostium.  A posterior septectomy was then performed with a Surveyor, quantity.  Bone, cartilage and soft tissue was then resected to create a wide posterior septotomy.  The anterior face of the sphenoid sinus and sphenoid sinus septum were then resected with a combination of through-cutting forceps, osteotome and microdebrider to allow direct access to the entire posterior aspect of the sphenoid sinus and pituitary fossa.  Sphenoid sinus mucosa overlying the pituitary fossa was elevated and lateralized.  The anterior face of the pituitary fossa was demarcated using navigation.  With adequate access to the pituitary fossa the neurosurgical component of the procedure was begun by Dr. Kathyrn Sheriff.  This is dictated as a separate operative report.  Resection of the pituitary tumor was undertaken using direct visualization of the 0 degree endoscope, navigation and blunt and sharp dissection.  With pituitary tumor resection completed, reconstruction was undertaken.  DuraGen placed overlying the  pituitary fossa dehiscence.  Adheris dural glue applied over the posterior sphenoid sinus.  Surgicel was placed over the pituitary fossa defect and the sphenoid sinus was loosely packed with Posi-Sep X2  absorbable nasal packing.  There was no active bleeding and no evidence of spinal fluid leak.  The sphenoid sinus was carefully inspected, no further bleeding along the mucosal margins, sphenoidotomy sites or posterior septectomy.  The patient's nasal cavity was irrigated and suctioned.  Nasal septoplasty reconstruction as above with placement of bilateral Doyle nasal septal splints after the application of Bactroban ointment.  Surgical sponge count was correct. An oral gastric tube was passed and the stomach contents were aspirated. Patient was awakened from anesthetic and transferred from the operating room to the recovery room in stable condition. There were no complications and blood loss was 100 cc.   Delsa Bern, M.D. [facility] 07/18/2021 nasal septoplasty

## 2021-07-18 NOTE — Transfer of Care (Signed)
Immediate Anesthesia Transfer of Care Note  Patient: [name]  Procedure(s) Performed: ENDOSCOPIC TRANSSPHENOIDAL RESECTION OF PITUITARY TUMOR NASAL SEPTOPLASTY WITH TURBINATE REDUCTION TRANSPHENOIDAL APPROACH EXPOSURE  Patient Location: PACU  Anesthesia Type:General  Level of Consciousness: oriented and drowsy  Airway & Oxygen Therapy: Patient Spontanous Breathing and Patient connected to face mask oxygen  Post-op Assessment: Report given to RN and Post -op Vital signs reviewed and unstable, Anesthesiologist notified  Post vital signs: Reviewed and unstable  Last Vitals:  Vitals Value Taken Time  BP 202/83 07/18/21 1145  Temp    Pulse 65 07/18/21 1153  Resp 7 07/18/21 1153  SpO2 100 % 07/18/21 1153  Vitals shown include unvalidated device data.  Last Pain:  Vitals:   07/18/21 0700  TempSrc:   PainSc: 8          Complications: No notable events documented.

## 2021-07-18 NOTE — Anesthesia Procedure Notes (Signed)
Procedure Name: Intubation Date/Time: 07/18/2021 9:00 AM  Performed by: Griffin Dakin, CRNAPre-anesthesia Checklist: Patient identified, Emergency Drugs available, Suction available and Patient being monitored Patient Re-evaluated:Patient Re-evaluated prior to induction Oxygen Delivery Method: Circle system utilized Preoxygenation: Pre-oxygenation with 100% oxygen Induction Type: IV induction Ventilation: Mask ventilation without difficulty and Oral airway inserted - appropriate to patient size Laryngoscope Size: Mac and 4 Grade View: Grade II Tube type: Oral Tube size: 7.0 mm Number of attempts: 1 Airway Equipment and Method: Stylet and Oral airway Placement Confirmation: ETT inserted through vocal cords under direct vision, positive ETCO2 and breath sounds checked- equal and bilateral Secured at: 23 cm Tube secured with: Tape Dental Injury: Teeth and Oropharynx as per pre-operative assessment

## 2021-07-18 NOTE — Discharge Instructions (Signed)
Sinus/Nasal Instructions:  1. Limited activity 2. Liquid and soft diet 3. May bathe and shower 4. Saline nasal spray - 4 puffs/nostril every hour while awake, begin the morning after surgery 5. Elevate Head of Bed 6. No nose blowing/Open mouth sneeze 7. Alternate Tylenol and ibuprofen every 6 hours as needed for pain.  Call [facility] for any nasal related questions or concerns: [phone]

## 2021-07-18 NOTE — Anesthesia Procedure Notes (Signed)
Arterial Line Insertion Start/End6/14/2023 8:00 AM, 07/18/2021 8:05 AM Performed by: Myrtie Soman, MD, Griffin Dakin, CRNA, CRNA  Patient location: Pre-op. Preanesthetic checklist: patient identified, IV checked, site marked, risks and benefits discussed, surgical consent, monitors and equipment checked, pre-op evaluation, timeout performed and anesthesia consent Lidocaine 1% used for infiltration Left, radial was placed Catheter size: 20 G Hand hygiene performed  and maximum sterile barriers used   Attempts: 1 Procedure performed without using ultrasound guided technique. Following insertion, dressing applied and Biopatch. Post procedure assessment: normal and unchanged  Patient tolerated the procedure well with no immediate complications.

## 2021-07-18 NOTE — Anesthesia Postprocedure Evaluation (Signed)
Anesthesia Post Note  Patient: [name]  Procedure(s) Performed: ENDOSCOPIC TRANSSPHENOIDAL RESECTION OF PITUITARY TUMOR SEPTOPLASTY TRANSPHENOIDAL APPROACH EXPOSURE     Patient location during evaluation: PACU Anesthesia Type: General Level of consciousness: awake and alert Pain management: pain level controlled Vital Signs Assessment: post-procedure vital signs reviewed and stable Respiratory status: spontaneous breathing, nonlabored ventilation, respiratory function stable and patient connected to nasal cannula oxygen Cardiovascular status: blood pressure returned to baseline and stable Postop Assessment: no apparent nausea or vomiting Anesthetic complications: no   No notable events documented.  Last Vitals:  Vitals:   07/18/21 1230 07/18/21 1245  BP: (!) 130/50 (!) 134/53  Pulse: 66 67  Resp: 10 11  Temp:    SpO2: 96% 94%    Last Pain:  Vitals:   07/18/21 1245  TempSrc:   PainSc: Asleep    LLE Motor Response: Purposeful movement (07/18/21 1245) LLE Sensation: Full sensation (07/18/21 1245) RLE Motor Response: Purposeful movement (07/18/21 1245) RLE Sensation: Full sensation (07/18/21 1245)      Alizia Greif S

## 2021-07-18 NOTE — Op Note (Signed)
NEUROSURGERY OPERATIVE NOTE   PREOP DIAGNOSIS:  Pituitary macroadenoma   POSTOP DIAGNOSIS: Same  PROCEDURE: Stereotactic endoscope transnasal transsphenoidal resection of pituitary tumor  SURGEON: Dr. Consuella Lose, MD  CO-SURGEON: Dr. Jerrell Belfast, MD (Otolaryngology)  ANESTHESIA: General Endotracheal  EBL: 50cc  SPECIMENS: Pituitary tumor for permanent pathology  DRAINS: None  COMPLICATIONS: None immediate  CONDITION: Hemodynamically stable to PACU  HISTORY: [name] is a 56 y.o. female initially seen in the outpatient neurosurgery clinic as a referral from ophthalmology for pituitary tumor with suprasellar extension.  Patient initially presented with headache and relatively acute visual change.  Examination by her ophthalmologist revealed bitemporal field cuts.  MRI did reveal the presence of the tumor.  She was seen preoperatively by endocrinology after serum endocrine profile did reveal panhypopituitarism.  Given her visual change due to optic compression, surgical decompression was recommended.  The risks, benefits, and alternatives to surgery were all reviewed in detail with the patient.  After all her questions were answered, informed consent was obtained and witnessed.  PROCEDURE IN DETAIL: The patient was brought to the operating room. After induction of general anesthesia, the patient was positioned on the operative table in the supine position. All pressure points were meticulously padded.  Face and nasal cavity was then prepped and draped by Dr. Wilburn Cornelia and usual fashion.  The stereotactic preoperative CT scan was registered with surface markers and a good accuracy was achieved.  After timeout was conducted, an endoscopic transnasal transsphenoidal approach was used to approach the sella by Dr. Wilburn Cornelia.  Details are dictated in a separate operative report.  Once we had access to the anterior and inferior margins of the sella, as well as the posterior  margin of the planum sphenoidale, the bone overlying the anterior and inferior face of the sella was noted to be quite thin.  This was easily punctured with rongeurs.  The dura was then coagulated and incised with 11 blade scalpel.  Immediately after opening the dura, a small amount of chronic blood and slightly more yellow cystic fluid was expressed.  The dural opening was then extended in cruciate fashion across the face of the sella.  After cystic contents were aspirated, soft, purple-colored tumor was noted.  Large portions of the tumor were removed with pituitary rongeurs and sent for permanent pathology.  I was able to easily suction a large portion of the tumor in the anterior aspect of the sella.  Once this was done, we did note the diaphragm sella to drop down into the operative field approximately halfway down the sella.  Using a combination of ring curettes, I was able to dissect away the soft tumor from the diaphragm until I was able to identify the dorsum sella.  We then worked laterally both on the right and left side, without any further tumor identified.  There did appear to be a small amount of remnant pituitary tissue in the inferior aspect of the sella on the left side.  The operative bed was then inspected with the endoscope, and no identifiable tumor was seen.  We then irrigated with normal saline irrigation.  No active bleeding was identified.  There was a small amount of CSF weeping from the diaphragm sella, but no obvious CSF leak.  The face of the sella was covered with a piece of DuraGen onlay graft.  This was then covered with polyethylene glycol sealant.  Valsalva maneuver was performed without any CSF egress.  Closure of the transsphenoidal approach was then effected  again by Dr. Wilburn Cornelia.  At the end of the case all sponge, needle, and instrument counts were correct. The patient was then transferred to the stretcher, extubated, and taken to the post-anesthesia care unit in  stable hemodynamic condition.    Consuella Lose, MD [facility]

## 2021-07-18 NOTE — H&P (Signed)
Chief Complaint   Pituitary tumor  History of Present Illness  [name] is a 56 y.o. female initially seen in the outpatient neurosurgery clinic at the request of Dr. Carolynn Sayers her ophthalmologist.  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  Her symptoms began several months ago when she noted relatively rapid onset of left-sided visual changes.  She felt like she had a blurry spot in the vision on her left side.  She was initially seen by her retinologist with her history of diabetes however no retinal pathology was noted.  She was referred to her primary ophthalmologist where she was found to have bitemporal field cuts.  MRI scan did reveal the sellar/suprasellar tumor.  In addition to her visual changes, patient does also complain of primarily left-sided retro-orbital headache.  Of note, the patient does have a history of hypertension and previous history of NSTEMI about 2 years ago.  She has been seen by her cardiologist for preoperative risk assessment.  Preoperative endocrine profile was also obtained and she has been seen by endocrinology.  She has been started on maintenance hydrocortisone.  Past Medical History   Past Medical History:  Diagnosis Date   Dental crown present    lower right   Depression    Diabetic retinopathy ([place])    OS   Hyperlipidemia    Hypertension    states under control with meds., has been on med. x 26 yrs.; states blood pressure goes up when she gets nervous   Insulin dependent diabetes mellitus    Migraines    Myocardial infarction [facility])    Obesity    Palpitations    Pneumonia    Trigger thumb of right hand 09/2016    Past Surgical History   Past Surgical History:  Procedure Laterality Date   ABDOMINAL HYSTERECTOMY     partial   CARDIAC CATHETERIZATION  05/27/2007   normal coronary arteries   CARPAL TUNNEL RELEASE Bilateral    CHOLECYSTECTOMY  1991   DE QUERVAIN'S RELEASE Right    GANGLION CYST EXCISION Left    wrist    INGUINAL HERNIA REPAIR Right    SOFT TISSUE MASS EXCISION Right 09/04/2009   gluteus   TRIGGER FINGER RELEASE Right 10/03/2016   Procedure: RELEASE TRIGGER FINGER/A-1 PULLEY RIGHT THUMB;  Surgeon: Leanora Cover, MD;  Location: [place];  Service: Orthopedics;  Laterality: Right;   UTERINE SUSPENSION      Social History   Social History   Tobacco Use   Smoking status: Former    Types: Cigarettes    Quit date: 02/03/1997    Years since quitting: 24.4   Smokeless tobacco: Never   Tobacco comments:    Smoked for 15 years  Vaping Use   Vaping Use: Never used  Substance Use Topics   Alcohol use: Not Currently    Comment: occasionally, rare   Drug use: No    Medications   Prior to Admission medications   Medication Sig Start Date End Date Taking? Authorizing Provider  amLODipine (NORVASC) 2.5 MG tablet Take 2.5 mg by mouth daily.   Yes [provider]  aspirin EC 81 MG EC tablet Take 1 tablet (81 mg total) by mouth daily. Swallow whole. 10/14/19  Yes Patrecia Pour, MD  atorvastatin (LIPITOR) 40 MG tablet Take 1 tablet (40 mg total) by mouth daily. 10/13/19  Yes Patrecia Pour, MD  empagliflozin (JARDIANCE) 25 MG TABS tablet TAKE 25 MG BY MOUTH DAILY. 06/25/19  Yes  Philemon Kingdom, MD  fexofenadine (ALLEGRA) 180 MG tablet Take 180 mg by mouth daily as needed for allergies or rhinitis.   Yes [provider]  glipiZIDE (GLUCOTROL) 10 MG tablet Take 10 mg by mouth 2 (two) times daily. 06/06/21  Yes [provider]  ibuprofen (ADVIL) 200 MG tablet Take 800 mg by mouth every 6 (six) hours as needed.   Yes [provider]  insulin isophane & regular human KwikPen (NOVOLIN 70/30 KWIKPEN) (70-30) 100 UNIT/ML KwikPen Inject 100 Units into the skin in the morning and at bedtime.   Yes [provider]  metFORMIN (GLUCOPHAGE) 1000 MG tablet Take 1 tablet (1,000 mg total) by mouth 2 (two) times daily with a meal. 06/25/19  Yes Philemon Kingdom,  MD  metoprolol tartrate (LOPRESSOR) 50 MG tablet Take 50 mg by mouth 2 (two) times daily. 06/04/21  Yes [provider]  sitaGLIPtin-metformin (JANUMET) 50-1000 MG tablet Take 1 tablet by mouth 2 (two) times daily with a meal.   Yes [provider]  traZODone (DESYREL) 50 MG tablet Take 25 tablets by mouth at bedtime as needed for sleep. 11/01/19  Yes [provider]  valsartan-hydrochlorothiazide (DIOVAN-HCT) 320-12.5 MG tablet Take 1 tablet by mouth daily.   Yes [provider]  venlafaxine XR (EFFEXOR-XR) 37.5 MG 24 hr capsule Take 37.5 mg by mouth every morning. 06/06/21  Yes [provider]  meclizine (ANTIVERT) 25 MG tablet Take 25 mg by mouth 3 (three) times daily as needed for dizziness.    [provider]  metoprolol tartrate (LOPRESSOR) 25 MG tablet Take 1 tablet (25 mg total) by mouth 2 (two) times daily. (may take an extra 1/2 tab as needed for palpitations) Patient not taking: Reported on 07/03/2021 11/12/19 07/03/21  Verta Ellen., NP  nitroGLYCERIN (NITROSTAT) 0.4 MG SL tablet Place 0.4 mg under the tongue every 5 (five) minutes as needed for chest pain. 09/28/20 09/28/21  [provider]  SUMAtriptan (IMITREX) 50 MG tablet Take 1 tablet (50 mg total) by mouth every 2 (two) hours as needed for migraine. May repeat in 2 hours if headache persists or recurs. Patient not taking: Reported on 07/03/2021 01/10/16   Marcial Pacas, MD    Allergies   Allergies  Allergen Reactions   Azo [Phenazopyridine] Hives and Swelling    FACIAL SWELLING, CHEST TIGHTNESS   Morphine And Related Nausea And Vomiting and Other (See Comments)    DIZZINESS   Opium Itching and Nausea And Vomiting    OPIODS/NARCOTICS - ITCHING OF NOSE, DIZZINESS   Sulfa Antibiotics Rash    Review of Systems  ROS  Neurologic Exam  Awake, alert, oriented Memory and concentration grossly intact Speech fluent, appropriate CN grossly intact Motor exam: Upper  Extremities Deltoid Bicep Tricep Grip  Right 5/5 5/5 5/5 5/5  Left 5/5 5/5 5/5 5/5   Lower Extremities IP Quad PF DF EHL  Right 5/5 5/5 5/5 5/5 5/5  Left 5/5 5/5 5/5 5/5 5/5   Sensation grossly intact to LT  Imaging  MRI of the brain with and without contrast was personally reviewed and demonstrates an approximately 2.5 cm sellar tumor with partially cystic component extending into the suprasellar region with compression of the optic apparatus.  There is likely some extension into the right cavernous sinus.  Impression  - 56 y.o. 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  She has panhypopituitary as a result.  Given her visual field cut I do  think surgical resection is necessary.  Plan  -We will plan on proceeding with endoscopic transsphenoidal resection of the pituitary tumor.  I have reviewed the details of the operation as well as the expected postoperative course and recovery with the patient and her family.  We have also reviewed the associated risks, benefits, and alternatives to surgery.  All her questions today were answered and she provided informed consent to proceed.   Consuella Lose, MD [facility]

## 2021-07-19 ENCOUNTER — Encounter (HOSPITAL_COMMUNITY): Payer: Self-pay | Admitting: Neurosurgery

## 2021-07-19 LAB — BASIC METABOLIC PANEL
Anion gap: 12 (ref 5–15)
BUN: 11 mg/dL (ref 6–20)
CO2: 22 mmol/L (ref 22–32)
Calcium: 8.3 mg/dL — ABNORMAL LOW (ref 8.9–10.3)
Chloride: 106 mmol/L (ref 98–111)
Creatinine, Ser: 0.68 mg/dL (ref 0.44–1.00)
GFR, Estimated: >60 mL/min (ref >60)
Glucose, Bld: 301 mg/dL — ABNORMAL HIGH (ref 70–99)
Potassium: 3.3 mmol/L — ABNORMAL LOW (ref 3.5–5.1)
Sodium: 140 mmol/L (ref 135–145)

## 2021-07-19 LAB — GLUCOSE, CAPILLARY
Glucose-Capillary: 287 mg/dL — ABNORMAL HIGH (ref 70–99)
Glucose-Capillary: 219 mg/dL — ABNORMAL HIGH (ref 70–99)
Glucose-Capillary: 140 mg/dL — ABNORMAL HIGH (ref 70–99)
Glucose-Capillary: 140 mg/dL — ABNORMAL HIGH (ref 70–99)

## 2021-07-19 MED ORDER — LORATADINE 10 MG PO TABS
10.0000 mg | ORAL_TABLET | Freq: Every day | ORAL | Status: DC | PRN
  Administered 2021-07-19: 10 mg via ORAL

## 2021-07-19 MED ORDER — ATORVASTATIN CALCIUM 40 MG PO TABS
40.0000 mg | ORAL_TABLET | Freq: Every evening | ORAL | Status: DC
  Administered 2021-07-19 – 2021-07-22 (×4): 40 mg via ORAL
  Filled 2021-07-19 (×3): qty 1

## 2021-07-19 MED ORDER — PANTOPRAZOLE SODIUM 40 MG PO TBEC
40.0000 mg | DELAYED_RELEASE_TABLET | Freq: Every day | ORAL | Status: DC
  Administered 2021-07-19 – 2021-07-22 (×4): 40 mg via ORAL
  Filled 2021-07-19 (×4): qty 1

## 2021-07-19 NOTE — Progress Notes (Addendum)
Inpatient Diabetes Program Recommendations  AACE/ADA: New Consensus Statement on Inpatient Glycemic Control (2015)  Target Ranges:  Prepandial:   less than 140 mg/dL      Peak postprandial:   less than 180 mg/dL (1-2 hours)      Critically ill patients:  140 - 180 mg/dL   Lab Results  Component Value Date   GLUCAP 219 (H) 07/19/2021   HGBA1C 7.7 (H) 07/09/2021    Review of Glycemic Control  Latest Reference Range & Units 07/18/21 20:47 07/18/21 22:27 07/19/21 08:24 07/19/21 11:28  Glucose-Capillary 70 - 99 mg/dL 256 (H) 321 (H) 287 (H) 219 (H)   Diabetes history: DM 2 Outpatient Diabetes medications:  Jardiance 25 mg daily, Glucotrol 10 mg big Novolin 70/30 100 units bid, Janumet 50-1000 mg bid Current orders for Inpatient glycemic control:  Novolog 0-20 units tid with meals and HS Novolog mix 70/30 100 units bid Tradjenta 5 mg with breakfast Metformin 1000 mg bid  Inpatient Diabetes Program Recommendations:    Home DM medications ordered.  A1C has improved since last visit with PCP.   Will follow.   Thanks,  Adah Perl, RN, BC-ADM Inpatient Diabetes Coordinator Pager [phone]  (8a-5p)  Addendum [phone]- Spoke with patient regarding DM management.  She states that she consistently takes insulin and is not concerned regarding going low.  Told patient to let RN know if she feels like CBG dropping.

## 2021-07-20 LAB — COMPREHENSIVE METABOLIC PANEL
ALT: 20 U/L (ref 0–44)
AST: 15 U/L (ref 15–41)
Albumin: 2.8 g/dL — ABNORMAL LOW (ref 3.5–5.0)
Alkaline Phosphatase: 62 U/L (ref 38–126)
Anion gap: 5 (ref 5–15)
BUN: 10 mg/dL (ref 6–20)
CO2: 26 mmol/L (ref 22–32)
Calcium: 8.1 mg/dL — ABNORMAL LOW (ref 8.9–10.3)
Chloride: 109 mmol/L (ref 98–111)
Creatinine, Ser: 0.66 mg/dL (ref 0.44–1.00)
GFR, Estimated: >60 mL/min (ref >60)
Glucose, Bld: 196 mg/dL — ABNORMAL HIGH (ref 70–99)
Potassium: 3.6 mmol/L (ref 3.5–5.1)
Sodium: 140 mmol/L (ref 135–145)
Total Bilirubin: 0.5 mg/dL (ref 0.3–1.2)
Total Protein: 5.8 g/dL — ABNORMAL LOW (ref 6.5–8.1)

## 2021-07-20 LAB — GLUCOSE, CAPILLARY
Glucose-Capillary: 226 mg/dL — ABNORMAL HIGH (ref 70–99)
Glucose-Capillary: 201 mg/dL — ABNORMAL HIGH (ref 70–99)
Glucose-Capillary: 178 mg/dL — ABNORMAL HIGH (ref 70–99)
Glucose-Capillary: 85 mg/dL (ref 70–99)
Glucose-Capillary: 94 mg/dL (ref 70–99)

## 2021-07-20 LAB — SODIUM
Sodium: 144 mmol/L (ref 135–145)
Sodium: 145 mmol/L (ref 135–145)
Sodium: 143 mmol/L (ref 135–145)
Sodium: 143 mmol/L (ref 135–145)

## 2021-07-20 MED ORDER — AMLODIPINE BESYLATE 10 MG PO TABS
10.0000 mg | ORAL_TABLET | Freq: Every day | ORAL | Status: DC
  Administered 2021-07-21 – 2021-07-23 (×3): 10 mg via ORAL
  Filled 2021-07-20 (×3): qty 1

## 2021-07-20 MED ORDER — AMLODIPINE BESYLATE 2.5 MG PO TABS
7.5000 mg | ORAL_TABLET | ORAL | Status: AC
Start: 2021-07-20 — End: 2021-07-20
  Administered 2021-07-20: 7.5 mg via ORAL
  Filled 2021-07-20: qty 1

## 2021-07-20 MED ORDER — DESMOPRESSIN ACETATE 4 MCG/ML IJ SOLN
1.0000 ug | Freq: Two times a day (BID) | INTRAMUSCULAR | Status: DC | PRN
  Administered 2021-07-20 – 2021-07-23 (×4): 1 ug via INTRAVENOUS
  Filled 2021-07-20 (×6): qty 1

## 2021-07-20 NOTE — Progress Notes (Signed)
  Transition of Care [facility]) Screening Note   Patient Details  Name: [name] Date of Birth: April 20, 1965   Transition of Care [facility] CM/SW Contact:    Ella Bodo, RN Phone Number: 07/20/2021, 5:02 PM    Transition of Care Department [facility]) has reviewed patient and no TOC needs have been identified at this time. We will continue to monitor patient advancement through interdisciplinary progression rounds. If new patient transition needs arise, please place a TOC consult.  Reinaldo Raddle, RN, BSN  Trauma/Neuro ICU Case Manager [phone]

## 2021-07-20 NOTE — Progress Notes (Signed)
  NEUROSURGERY PROGRESS NOTE   Pt seen and examined. No issues overnight. Minimal HA. Vision remains good postop. No drainage of fluid from nose.  EXAM: Temp:  [98.2 F (36.8 C)-98.5 F (36.9 C)] 98.5 F (36.9 C) (06/16 1200) Pulse Rate:  [60-76] 68 (06/16 1400) Resp:  [11-19] 16 (06/16 1400) BP: (136-172)/(59-78) 154/63 (06/16 1400) SpO2:  [89 %-100 %] 98 % (06/16 1400) Intake/Output      06/15 0701 06/16 0700 06/16 0701 06/17 0700   I.V. (mL/kg) 1791.5 (14.5) 527.1 (4.3)   IV Piggyback     Total Intake(mL/kg) 1791.5 (14.5) 527.1 (4.3)   Urine (mL/kg/hr) 4725 (1.6) 3725 (3.4)   Blood     Total Output 4725 3725   Net -2933.5 -3198         Awake, alert, oriented Speech fluent CN intact MAE well No leakage from nose   LABS: Lab Results  Component Value Date   CREATININE 0.66 07/20/2021   BUN 10 07/20/2021   NA 144 07/20/2021   K 3.6 07/20/2021   CL 109 07/20/2021   CO2 26 07/20/2021   Lab Results  Component Value Date   WBC 14.7 (H) 07/09/2021   HGB 13.0 07/09/2021   HCT 40.0 07/09/2021   MCV 84.4 07/09/2021   PLT 363 07/09/2021    IMPRESSION: - 56 y.o. female POD#1 s/p endoscopic transnasal transsphenoidal resection of tumor, at neurologic baseline - Panhypopituitarism preop - UO elevated but serum Na stable - DM - HTN  PLAN: - d/c Foley, will get Q4h Na. Maintain easy access to water. May need ddAVP - OOB - Cont home hypoglycemics and insulin/SS - Will increase Norvasc dose  - Cont hydrocortisone 20/10 - Cont Keflex per Dr. Gilford Silvius, MD [facility]

## 2021-07-20 NOTE — Inpatient Diabetes Management (Signed)
Inpatient Diabetes Program Recommendations  AACE/ADA: New Consensus Statement on Inpatient Glycemic Control (2015)  Target Ranges:  Prepandial:   less than 140 mg/dL      Peak postprandial:   less than 180 mg/dL (1-2 hours)      Critically ill patients:  140 - 180 mg/dL   Lab Results  Component Value Date   GLUCAP 201 (H) 07/20/2021   HGBA1C 7.7 (H) 07/09/2021    Review of Glycemic Control  Latest Reference Range & Units 07/20/21 08:39 07/20/21 11:48  Glucose-Capillary 70 - 99 mg/dL 226 (H) 201 (H)   Diabetes history: DM 2 Outpatient Diabetes medications:  Jardiance 25 mg daily, Glucotrol 10 mg big Novolin 70/30 100 units bid, Janumet 50-1000 mg bid  Current orders for Inpatient glycemic control:  Novolog 0-20 units tid with meals and HS Novolog mix 70/30 100 units bid Tradjenta 5 mg with breakfast Metformin 1000 mg bid  Inpatient Diabetes Program Recommendations:    Note that blood sugars increased this AM.  However patient did not receive PM dose of 70/30 yesterday evening.   May consider reducing Novolog 70/30 mix to 80 units bid while in the hospital.  Thanks,  Adah Perl, RN, BC-ADM Inpatient Diabetes Coordinator Pager [phone]  (8a-5p)

## 2021-07-20 NOTE — Progress Notes (Signed)
  NEUROSURGERY PROGRESS NOTE   Pt seen and examined. No issues overnight. Minimal HA, reports noticeable improvement in peripheral vision since surgery. No drainage of fluid from nose.  EXAM: Temp:  [98.2 F (36.8 C)-98.5 F (36.9 C)] 98.5 F (36.9 C) (06/16 1200) Pulse Rate:  [60-76] 68 (06/16 1400) Resp:  [11-19] 16 (06/16 1400) BP: (136-172)/(59-78) 154/63 (06/16 1400) SpO2:  [89 %-100 %] 98 % (06/16 1400) Intake/Output      06/15 0701 06/16 0700 06/16 0701 06/17 0700   I.V. (mL/kg) 1791.5 (14.5) 527.1 (4.3)   IV Piggyback     Total Intake(mL/kg) 1791.5 (14.5) 527.1 (4.3)   Urine (mL/kg/hr) 4725 (1.6) 3725 (3.4)   Blood     Total Output 4725 3725   Net -2933.5 -3198         Awake, alert, oriented Speech fluent CN intact MAE well No leakage from nose   LABS: Lab Results  Component Value Date   CREATININE 0.66 07/20/2021   BUN 10 07/20/2021   NA 144 07/20/2021   K 3.6 07/20/2021   CL 109 07/20/2021   CO2 26 07/20/2021   Lab Results  Component Value Date   WBC 14.7 (H) 07/09/2021   HGB 13.0 07/09/2021   HCT 40.0 07/09/2021   MCV 84.4 07/09/2021   PLT 363 07/09/2021    IMPRESSION: - 56 y.o. female POD#1 s/p endoscopic transnasal transsphenoidal resection of tumor, at neurologic baseline - Panhypopituitarism preop - UO elevated but serum Na stable - DM - HTN  PLAN: - Cont to monitor UO and serum Na, maintain easy access to water - OOB - Cont home hypoglycemics and insulin/SS - Cont home anti-hypertensives  Consuella Lose, MD [facility]

## 2021-07-21 LAB — GLUCOSE, CAPILLARY
Glucose-Capillary: 134 mg/dL — ABNORMAL HIGH (ref 70–99)
Glucose-Capillary: 122 mg/dL — ABNORMAL HIGH (ref 70–99)
Glucose-Capillary: 155 mg/dL — ABNORMAL HIGH (ref 70–99)
Glucose-Capillary: 138 mg/dL — ABNORMAL HIGH (ref 70–99)

## 2021-07-21 LAB — SODIUM
Sodium: 139 mmol/L (ref 135–145)
Sodium: 136 mmol/L (ref 135–145)
Sodium: 140 mmol/L (ref 135–145)
Sodium: 137 mmol/L (ref 135–145)
Sodium: 135 mmol/L (ref 135–145)

## 2021-07-21 MED ORDER — BUTALBITAL-APAP-CAFFEINE 50-325-40 MG PO TABS
1.0000 | ORAL_TABLET | ORAL | Status: DC | PRN
  Administered 2021-07-21 – 2021-07-22 (×3): 2 via ORAL
  Filled 2021-07-21 (×3): qty 2

## 2021-07-21 MED ORDER — HYDRALAZINE HCL 20 MG/ML IJ SOLN
10.0000 mg | INTRAMUSCULAR | Status: DC | PRN
Start: 2021-07-21 — End: 2021-07-23
  Administered 2021-07-21 – 2021-07-22 (×6): 10 mg via INTRAVENOUS
  Filled 2021-07-21 (×6): qty 1

## 2021-07-21 NOTE — Progress Notes (Signed)
Pt with increased urine output, low urine specific gravity. BP >150 despite PRN medications and complaining of a headache. DDAVP given per [facility] order. MD Elsner notified. Pt requesting non-opioid pain medication. Verbal order obtained for fioricet. See MAR.

## 2021-07-21 NOTE — Progress Notes (Signed)
Patient ID: [name], female   DOB: Feb 02, 1966, 56 y.o.   MRN: [id number] Vital signs are stable Patient has been hypertensive and has been difficult to control She still may have a touch of diabetes insipidus She is drinking and I have suggested that we cut her IV fluid down We will continue to observe her clinically Vision does appear to be improving substantially

## 2021-07-22 LAB — GLUCOSE, CAPILLARY
Glucose-Capillary: 87 mg/dL (ref 70–99)
Glucose-Capillary: 177 mg/dL — ABNORMAL HIGH (ref 70–99)
Glucose-Capillary: 164 mg/dL — ABNORMAL HIGH (ref 70–99)
Glucose-Capillary: 162 mg/dL — ABNORMAL HIGH (ref 70–99)
Glucose-Capillary: 73 mg/dL (ref 70–99)
Glucose-Capillary: 89 mg/dL (ref 70–99)

## 2021-07-22 LAB — SODIUM
Sodium: 135 mmol/L (ref 135–145)
Sodium: 133 mmol/L — ABNORMAL LOW (ref 135–145)
Sodium: 133 mmol/L — ABNORMAL LOW (ref 135–145)
Sodium: 132 mmol/L — ABNORMAL LOW (ref 135–145)
Sodium: 129 mmol/L — ABNORMAL LOW (ref 135–145)
Sodium: 132 mmol/L — ABNORMAL LOW (ref 135–145)

## 2021-07-22 NOTE — Progress Notes (Signed)
Patient ID: [name], female   DOB: May 04, 1965, 56 y.o.   MRN: [id number] Vital signs are stable and blood pressure is improving however body still having episodes of diabetes insipidus and has required further DDAVP.  Headaches have responded well to the Fioricet.  At the current time she is ambulatory and will advance her diet to regular food.  She will continue to be maintained in the ICU to monitor her I's and O's and her diabetes insipidus.

## 2021-07-22 NOTE — Plan of Care (Signed)
  Problem: Metabolic: Goal: Ability to maintain appropriate glucose levels will improve Outcome: Progressing   Problem: Nutritional: Goal: Maintenance of adequate nutrition will improve Outcome: Progressing   Problem: Clinical Measurements: Goal: Will remain free from infection Outcome: Progressing Goal: Diagnostic test results will improve Outcome: Progressing Goal: Respiratory complications will improve Outcome: Progressing   Problem: Coping: Goal: Ability to adjust to condition or change in health will improve Outcome: Completed/Met   Problem: Health Behavior/Discharge Planning: Goal: Ability to identify and utilize available resources and services will improve Outcome: Completed/Met   Problem: Skin Integrity: Goal: Risk for impaired skin integrity will decrease Outcome: Completed/Met

## 2021-07-23 LAB — SODIUM
Sodium: 136 mmol/L (ref 135–145)
Sodium: 134 mmol/L — ABNORMAL LOW (ref 135–145)

## 2021-07-23 LAB — GLUCOSE, CAPILLARY: Glucose-Capillary: 128 mg/dL — ABNORMAL HIGH (ref 70–99)

## 2021-07-23 MED ORDER — HYDROCORTISONE 20 MG PO TABS: 20.0000 mg | ORAL_TABLET | Freq: Every morning | ORAL | 0 refills | Status: AC

## 2021-07-23 MED ORDER — HYDROCORTISONE 10 MG PO TABS: 10.0000 mg | ORAL_TABLET | Freq: Every evening | ORAL | 0 refills | Status: AC

## 2021-07-23 MED ORDER — AMLODIPINE BESYLATE 2.5 MG PO TABS: 10.0000 mg | ORAL_TABLET | Freq: Every day | ORAL | 0 refills | Status: AC

## 2021-07-23 MED ORDER — DESMOPRESSIN ACETATE 0.1 MG PO TABS: 0.0500 mg | ORAL_TABLET | Freq: Two times a day (BID) | ORAL | 0 refills | Status: AC | PRN

## 2021-07-23 MED ORDER — CEPHALEXIN 500 MG PO CAPS: 500.0000 mg | ORAL_CAPSULE | Freq: Three times a day (TID) | ORAL | 0 refills | Status: AC

## 2021-07-23 NOTE — Plan of Care (Signed)
Care plans met for discharge

## 2021-07-23 NOTE — Progress Notes (Signed)
  NEUROSURGERY PROGRESS NOTE   No issues overnight. No HA. Has required approximately 2 doses PRN ddAVP IV a day over the last few days.  EXAM:  BP (!) 145/120   Pulse 72   Temp 98.2 F (36.8 C) (Oral)   Resp 18   Ht '5\' 7"'$  (1.702 m)   Wt 123.4 kg   SpO2 98%   BMI 42.61 kg/m   Awake, alert, oriented  Speech fluent, appropriate  CN grossly intact  5/5 BUE/BLE   LABS: Na: 134 <- 136 <- 132  IMPRESSION:  56 y.o. female POD# 5 s/p resection of pituitary macroadenoma, doing well. Vision improved, baseline panhypopit with postop DI, Na stable on low dose ddAVP. - Hypertension  PLAN: - Can d/c home today with PRN ddAVP 0.'05mg'$  PO, Cont 20/10 hydrocortisone and current anti-hypertensives - already has f/u with me, Dr. Wilburn Cornelia, and Dr. Chalmers Cater (endocrinology) in the next week.   Consuella Lose, MD [facility]

## 2021-07-23 NOTE — Discharge Summary (Signed)
Physician Discharge Summary  Patient ID: [name] MRN: [id number] DOB/AGE: 1965-08-02 56 y.o.  Admit date: 07/18/2021 Discharge date: 07/23/2021  Admission Diagnoses:  Pituitary macroadenoma  Discharge Diagnoses:  Same Principal Problem:   Status post transsphenoidal pituitary resection [facility]) Active Problems:   Pituitary macroadenoma [facility])   Discharged Condition: Stable  Hospital Course:  [name] is a 56 y.o. female admitted after uncomplicated transsphenoidal resection of tumor. She was at baseline postop with significantly improved vision. No evidence of CSF rhinorrhea. She had postop DI treated with ddAVP. Her sodium remained stable and she was discharged in stable condition, ambulating well, tolerating diet, with minimal HA.  Treatments: Surgery - Transsphenoidal resection of tumor  Discharge Exam: Blood pressure (!) 145/120, pulse 72, temperature 98.2 F (36.8 C), temperature source Oral, resp. rate 18, height '5\' 7"'$  (1.702 m), weight 123.4 kg, SpO2 98 %. Awake, alert, oriented Speech fluent, appropriate CN grossly intact 5/5 BUE/BLE Wound c/d/i  Disposition: Discharge disposition: 01-Home or Self Care       Discharge Instructions     Call MD for:  redness, tenderness, or signs of infection (pain, swelling, redness, odor or green/yellow discharge around incision site)   Complete by: As directed    Call MD for:  temperature >100.4   Complete by: As directed    Diet - low sodium heart healthy   Complete by: As directed    Discharge instructions   Complete by: As directed    Walk at home as much as possible, at least 4 times / day   Increase activity slowly   Complete by: As directed    Lifting restrictions   Complete by: As directed    No lifting > 10 lbs   May shower / Bathe   Complete by: As directed    48 hours after surgery   May walk up steps   Complete by: As directed    No wound care   Complete by: As directed    Other Restrictions    Complete by: As directed    No bending/twisting at waist      Allergies as of 07/23/2021       Reactions   Azo [phenazopyridine] Hives, Swelling   FACIAL SWELLING, CHEST TIGHTNESS   Morphine And Related Nausea And Vomiting, Other (See Comments)   DIZZINESS   Opium Itching, Nausea And Vomiting   OPIODS/NARCOTICS - ITCHING OF NOSE, DIZZINESS   Dulaglutide Other (See Comments)   Empagliflozin Other (See Comments)   Sulfa Antibiotics Rash        Medication List     STOP taking these medications    aspirin EC 81 MG tablet   SUMAtriptan 50 MG tablet Commonly known as: Imitrex       TAKE these medications    amLODipine 2.5 MG tablet Commonly known as: NORVASC Take 4 tablets (10 mg total) by mouth daily. What changed: how much to take   atorvastatin 40 MG tablet Commonly known as: LIPITOR Take 1 tablet (40 mg total) by mouth daily.   cephALEXin 500 MG capsule Commonly known as: Keflex Take 1 capsule (500 mg total) by mouth 3 (three) times daily for 7 days.   desmopressin 0.1 MG tablet Commonly known as: DDAVP Take 0.5 tablets (0.05 mg total) by mouth 2 (two) times daily as needed (Urine Output > 200cc/hr x 2 hrs).   fexofenadine 180 MG tablet Commonly known as: ALLEGRA Take 180 mg by mouth daily as needed for allergies or  rhinitis.   glipiZIDE 10 MG tablet Commonly known as: GLUCOTROL Take 10 mg by mouth 2 (two) times daily.   hydrocortisone 10 MG tablet Commonly known as: CORTEF Take 1 tablet (10 mg total) by mouth every evening. What changed: You were already taking a medication with the same name, and this prescription was added. Make sure you understand how and when to take each.   hydrocortisone 20 MG tablet Commonly known as: CORTEF Take 1 tablet (20 mg total) by mouth every morning. Start taking on: July 24, 2021 What changed:  medication strength how much to take when to take this   ibuprofen 200 MG tablet Commonly known as: ADVIL Take 800  mg by mouth every 6 (six) hours as needed for headache or mild pain.   insulin NPH-regular Human (70-30) 100 UNIT/ML injection Inject 100 Units into the skin 2 (two) times daily before a meal. Uses Vial   Jardiance 25 MG Tabs tablet Generic drug: empagliflozin TAKE 25 MG BY MOUTH DAILY.   meclizine 25 MG tablet Commonly known as: ANTIVERT Take 25 mg by mouth 3 (three) times daily as needed for dizziness.   metoprolol tartrate 50 MG tablet Commonly known as: LOPRESSOR Take 50 mg by mouth 2 (two) times daily.   nitroGLYCERIN 0.4 MG SL tablet Commonly known as: NITROSTAT Place 0.4 mg under the tongue every 5 (five) minutes as needed for chest pain.   sitaGLIPtin-metformin 50-1000 MG tablet Commonly known as: JANUMET Take 1 tablet by mouth 2 (two) times daily with a meal.   traZODone 50 MG tablet Commonly known as: DESYREL Take 25 mg by mouth at bedtime as needed for sleep.   valsartan-hydrochlorothiazide 320-12.5 MG tablet Commonly known as: DIOVAN-HCT Take 1 tablet by mouth daily.   venlafaxine XR 37.5 MG 24 hr capsule Commonly known as: EFFEXOR-XR Take 37.5 mg by mouth every morning.        Follow-up Information     Jerrell Belfast, MD. Schedule an appointment as soon as possible for a visit in 2 week(s).   Specialty: Otolaryngology Why: For splint removal. Contact information: [address] [place] [address] [phone]         Jacelyn Pi, MD Follow up.   Specialty: Endocrinology Contact information: [place] [address] [phone]         Consuella Lose, MD Follow up.   Specialty: Neurosurgery Contact information: [address] [place] [address] [phone]                 Signed: Jairo Ben 07/23/2021, 10:03 AM

## 2021-07-24 LAB — SURGICAL PATHOLOGY

## 2022-01-07 ENCOUNTER — Other Ambulatory Visit: Payer: Self-pay | Admitting: Neurosurgery

## 2022-01-07 DIAGNOSIS — D352 Benign neoplasm of pituitary gland: Secondary | ICD-10-CM

## 2022-01-21 ENCOUNTER — Encounter: Payer: Self-pay | Admitting: Neurosurgery

## 2022-02-10 ENCOUNTER — Ambulatory Visit
Admission: RE | Admit: 2022-02-10 | Discharge: 2022-02-10 | Disposition: A | Discharge status: Home / Self Care | Payer: Managed Care, Other (non HMO) | Source: Ambulatory Visit | Attending: Neurosurgery | Admitting: Neurosurgery

## 2022-02-10 DIAGNOSIS — D352 Benign neoplasm of pituitary gland: Secondary | ICD-10-CM

## 2022-02-10 MED ORDER — GADOPICLENOL 0.5 MMOL/ML IV SOLN
5.0000 mL | Freq: Once | INTRAVENOUS | Status: AC | PRN
  Administered 2022-02-10: 5 mL via INTRAVENOUS

## 2022-09-13 ENCOUNTER — Other Ambulatory Visit: Payer: Self-pay | Admitting: Internal Medicine

## 2022-09-13 DIAGNOSIS — Z1231 Encounter for screening mammogram for malignant neoplasm of breast: Secondary | ICD-10-CM

## 2022-10-04 ENCOUNTER — Ambulatory Visit
Admission: RE | Admit: 2022-10-04 | Discharge: 2022-10-04 | Disposition: A | Discharge status: Home / Self Care | Payer: BC Managed Care – PPO | Source: Ambulatory Visit | Attending: Internal Medicine | Admitting: Internal Medicine

## 2022-10-04 DIAGNOSIS — Z1231 Encounter for screening mammogram for malignant neoplasm of breast: Secondary | ICD-10-CM

## 2022-10-15 ENCOUNTER — Telehealth: Payer: Self-pay | Admitting: Neurology

## 2022-10-15 NOTE — Telephone Encounter (Signed)
Pt cancelled appt due to having a family issue. Will call back to reschedule.

## 2022-10-18 ENCOUNTER — Ambulatory Visit: Payer: BC Managed Care – PPO | Admitting: Neurology

## 2022-11-28 LAB — HM DIABETES EYE EXAM

## 2022-12-24 NOTE — Telephone Encounter (Signed)
Received ophthalmology evaluation by Dr. Maxwell Caul November 28, 2022, type 2 diabetes with ocular complications, diabetes for 17 years, on polypharmacy, mild NPDR OU, mild nonproliferative diabetic retinopathy, no ocular migraine, intractable, history of pituitary adenoma, new hypertensive retinopathy OU,  TIA Jun 18, 2022, blurry vision since Jun 14, 2022, OS pupil was huge, OD was normal, left facial droop, left upper extremity droop last for 2 days, emergency evaluation at [facility], MRI showed no acute abnormality,  Aspirin 325 mg, also Plavix for 3 months,  New dry eye syndrome,

## 2023-01-23 ENCOUNTER — Ambulatory Visit: Payer: BC Managed Care – PPO | Admitting: Neurology

## 2023-02-18 ENCOUNTER — Ambulatory Visit: Payer: BC Managed Care – PPO | Admitting: Neurology

## 2023-03-17 ENCOUNTER — Other Ambulatory Visit: Payer: Self-pay | Admitting: Neurosurgery

## 2023-03-17 DIAGNOSIS — D352 Benign neoplasm of pituitary gland: Secondary | ICD-10-CM

## 2023-03-20 ENCOUNTER — Encounter: Payer: Self-pay | Admitting: Neurosurgery

## 2023-03-21 ENCOUNTER — Encounter: Payer: Self-pay | Admitting: Neurosurgery

## 2023-03-24 ENCOUNTER — Encounter: Payer: Self-pay | Admitting: Neurosurgery

## 2023-03-26 ENCOUNTER — Other Ambulatory Visit: Payer: BC Managed Care – PPO

## 2023-05-29 DIAGNOSIS — E232 Diabetes insipidus: Secondary | ICD-10-CM | POA: Diagnosis not present

## 2023-05-30 DIAGNOSIS — E038 Other specified hypothyroidism: Secondary | ICD-10-CM | POA: Diagnosis not present

## 2023-05-30 DIAGNOSIS — D352 Benign neoplasm of pituitary gland: Secondary | ICD-10-CM | POA: Diagnosis not present

## 2023-05-30 DIAGNOSIS — E1165 Type 2 diabetes mellitus with hyperglycemia: Secondary | ICD-10-CM | POA: Diagnosis not present

## 2023-05-30 DIAGNOSIS — E7800 Pure hypercholesterolemia, unspecified: Secondary | ICD-10-CM | POA: Diagnosis not present

## 2023-06-09 DIAGNOSIS — E559 Vitamin D deficiency, unspecified: Secondary | ICD-10-CM | POA: Diagnosis not present

## 2023-06-09 DIAGNOSIS — N951 Menopausal and female climacteric states: Secondary | ICD-10-CM | POA: Diagnosis not present

## 2023-06-09 DIAGNOSIS — E1165 Type 2 diabetes mellitus with hyperglycemia: Secondary | ICD-10-CM | POA: Diagnosis not present

## 2023-06-09 DIAGNOSIS — D352 Benign neoplasm of pituitary gland: Secondary | ICD-10-CM | POA: Diagnosis not present

## 2023-06-09 DIAGNOSIS — E038 Other specified hypothyroidism: Secondary | ICD-10-CM | POA: Diagnosis not present

## 2023-06-09 DIAGNOSIS — I10 Essential (primary) hypertension: Secondary | ICD-10-CM | POA: Diagnosis not present

## 2023-06-09 DIAGNOSIS — E232 Diabetes insipidus: Secondary | ICD-10-CM | POA: Diagnosis not present

## 2023-06-09 DIAGNOSIS — E2740 Unspecified adrenocortical insufficiency: Secondary | ICD-10-CM | POA: Diagnosis not present

## 2023-07-14 DIAGNOSIS — E785 Hyperlipidemia, unspecified: Secondary | ICD-10-CM | POA: Diagnosis not present

## 2023-07-14 DIAGNOSIS — E039 Hypothyroidism, unspecified: Secondary | ICD-10-CM | POA: Diagnosis not present

## 2023-07-14 DIAGNOSIS — Z0000 Encounter for general adult medical examination without abnormal findings: Secondary | ICD-10-CM | POA: Diagnosis not present

## 2023-07-14 DIAGNOSIS — Z79899 Other long term (current) drug therapy: Secondary | ICD-10-CM | POA: Diagnosis not present

## 2023-07-14 DIAGNOSIS — R1013 Epigastric pain: Secondary | ICD-10-CM | POA: Diagnosis not present

## 2023-08-15 DIAGNOSIS — N390 Urinary tract infection, site not specified: Secondary | ICD-10-CM | POA: Diagnosis not present

## 2023-10-08 DIAGNOSIS — E038 Other specified hypothyroidism: Secondary | ICD-10-CM | POA: Diagnosis not present

## 2023-10-08 DIAGNOSIS — E7800 Pure hypercholesterolemia, unspecified: Secondary | ICD-10-CM | POA: Diagnosis not present

## 2023-10-08 DIAGNOSIS — E232 Diabetes insipidus: Secondary | ICD-10-CM | POA: Diagnosis not present

## 2023-10-08 DIAGNOSIS — D352 Benign neoplasm of pituitary gland: Secondary | ICD-10-CM | POA: Diagnosis not present

## 2023-10-08 DIAGNOSIS — E1165 Type 2 diabetes mellitus with hyperglycemia: Secondary | ICD-10-CM | POA: Diagnosis not present

## 2023-10-10 DIAGNOSIS — E038 Other specified hypothyroidism: Secondary | ICD-10-CM | POA: Diagnosis not present

## 2023-10-10 DIAGNOSIS — E7800 Pure hypercholesterolemia, unspecified: Secondary | ICD-10-CM | POA: Diagnosis not present

## 2023-10-10 DIAGNOSIS — E559 Vitamin D deficiency, unspecified: Secondary | ICD-10-CM | POA: Diagnosis not present

## 2023-10-10 DIAGNOSIS — I10 Essential (primary) hypertension: Secondary | ICD-10-CM | POA: Diagnosis not present

## 2023-10-10 DIAGNOSIS — E230 Hypopituitarism: Secondary | ICD-10-CM | POA: Diagnosis not present

## 2023-10-10 DIAGNOSIS — E232 Diabetes insipidus: Secondary | ICD-10-CM | POA: Diagnosis not present

## 2023-10-10 DIAGNOSIS — N951 Menopausal and female climacteric states: Secondary | ICD-10-CM | POA: Diagnosis not present

## 2023-10-10 DIAGNOSIS — D352 Benign neoplasm of pituitary gland: Secondary | ICD-10-CM | POA: Diagnosis not present

## 2023-10-10 DIAGNOSIS — E1165 Type 2 diabetes mellitus with hyperglycemia: Secondary | ICD-10-CM | POA: Diagnosis not present

## 2023-10-10 DIAGNOSIS — E2740 Unspecified adrenocortical insufficiency: Secondary | ICD-10-CM | POA: Diagnosis not present

## 2023-10-24 DIAGNOSIS — N390 Urinary tract infection, site not specified: Secondary | ICD-10-CM | POA: Diagnosis not present

## 2023-10-24 DIAGNOSIS — E119 Type 2 diabetes mellitus without complications: Secondary | ICD-10-CM | POA: Diagnosis not present

## 2023-12-25 ENCOUNTER — Ambulatory Visit: Admitting: Cardiology

## 2023-12-30 DIAGNOSIS — N390 Urinary tract infection, site not specified: Secondary | ICD-10-CM | POA: Diagnosis not present

## 2023-12-31 ENCOUNTER — Ambulatory Visit: Admitting: Cardiology

## 2024-02-24 ENCOUNTER — Ambulatory Visit: Payer: Self-pay | Admitting: Cardiology

## 2024-02-26 NOTE — Progress Notes (Unsigned)
 [name]                                          MRN: [id number]   02/26/2024   The [facility] Quality Team Specialist reviewed this patient medical record for the purposes of chart review for care gap closure. The following were reviewed: chart review for care gap closure-{CHL AMB [facility] QUALITY SPECIALIST CARE HJED:[id number]}.    [facility]I Quality Team
